# Patient Record
Sex: Female | Born: 1980 | Race: Black or African American | Hispanic: No | Marital: Single | Smoking: Former smoker
Health system: Southern US, Community
[De-identification: ages and names within clinical notes are randomized; demographics above are authoritative.]

## PROBLEM LIST (undated history)

## (undated) DIAGNOSIS — K219 Gastro-esophageal reflux disease without esophagitis: Secondary | ICD-10-CM

## (undated) DIAGNOSIS — I10 Essential (primary) hypertension: Secondary | ICD-10-CM

## (undated) HISTORY — DX: Gastro-esophageal reflux disease without esophagitis: K21.9

---

## 2004-11-24 ENCOUNTER — Observation Stay: Payer: Self-pay | Admitting: Obstetrics and Gynecology

## 2004-12-30 ENCOUNTER — Observation Stay: Payer: Self-pay | Admitting: Obstetrics and Gynecology

## 2005-01-29 ENCOUNTER — Inpatient Hospital Stay: Payer: Self-pay | Admitting: Obstetrics and Gynecology

## 2005-12-13 ENCOUNTER — Emergency Department: Payer: Self-pay

## 2005-12-14 ENCOUNTER — Ambulatory Visit: Payer: Self-pay

## 2007-01-29 ENCOUNTER — Emergency Department: Payer: Self-pay | Admitting: Unknown Physician Specialty

## 2007-05-27 ENCOUNTER — Ambulatory Visit: Payer: Self-pay | Admitting: Family Medicine

## 2007-10-11 ENCOUNTER — Observation Stay: Payer: Self-pay

## 2007-10-15 ENCOUNTER — Inpatient Hospital Stay: Payer: Self-pay | Admitting: Obstetrics and Gynecology

## 2008-11-16 ENCOUNTER — Observation Stay: Payer: Self-pay | Admitting: Unknown Physician Specialty

## 2009-02-24 IMAGING — US US OB US >=[ID] SNGL FETUS
1 series · 14 of 28 positions shown · non-contrast
Comparison: none

REASON FOR EXAM: size dates fetal anatomy
COMMENTS:

[Series 1: us ob us >=(id) sngl fetus · 0.33mm/px · 14 of 59 slices shown]
[im 3/59]
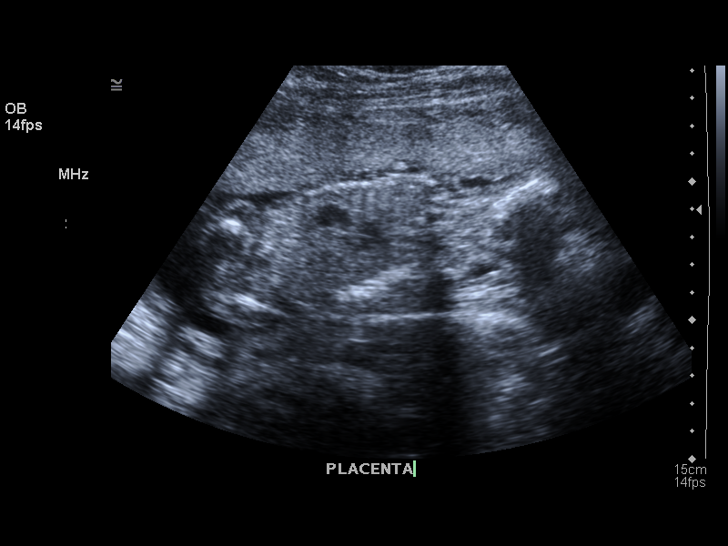
[im 7/59]
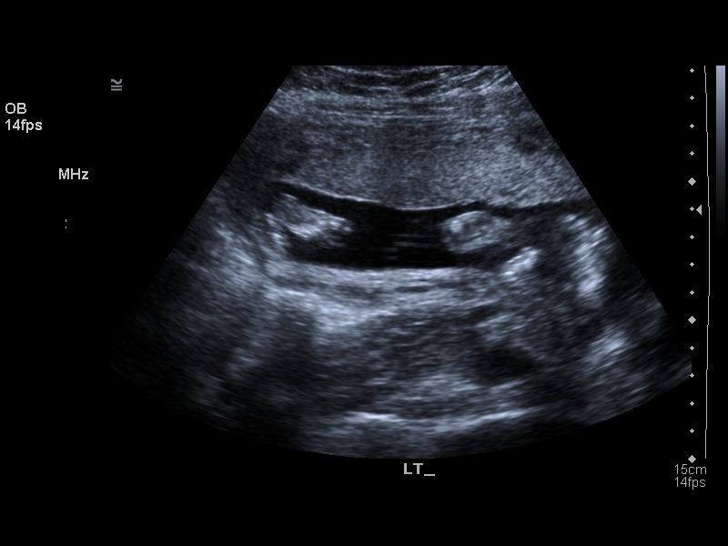
[im 11/59]
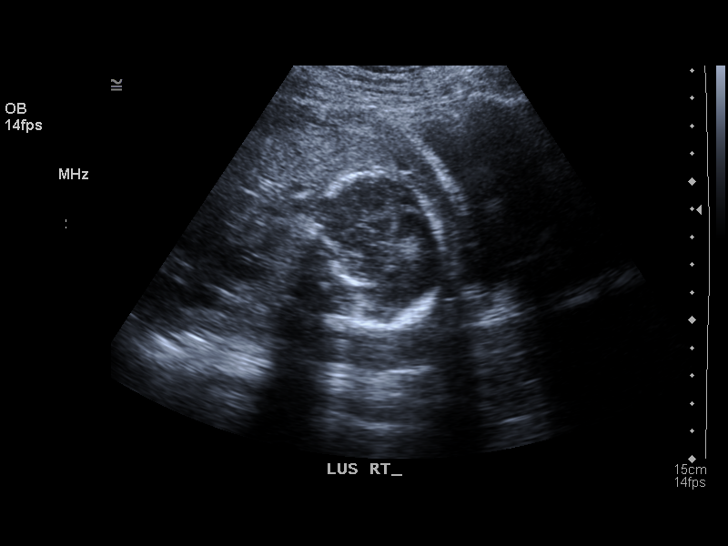
[im 16/59]
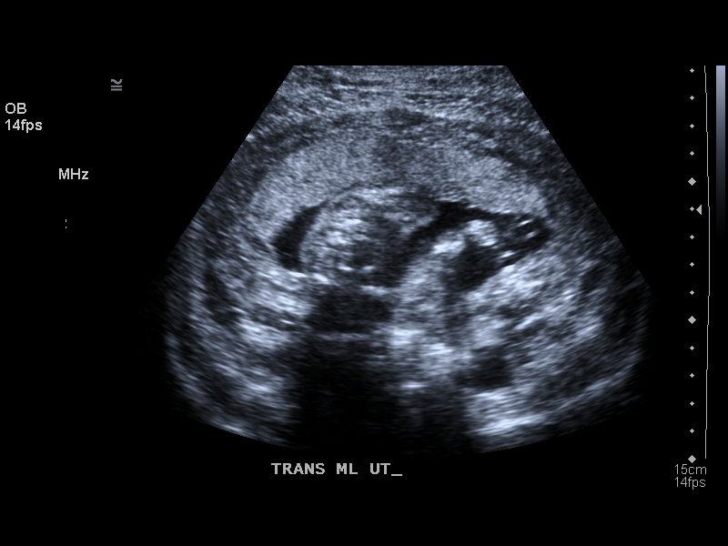
[im 20/59]
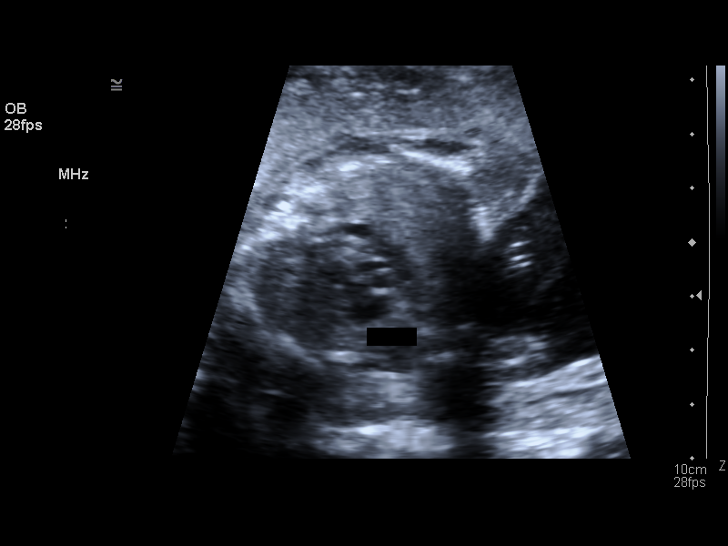
[im 24/59]
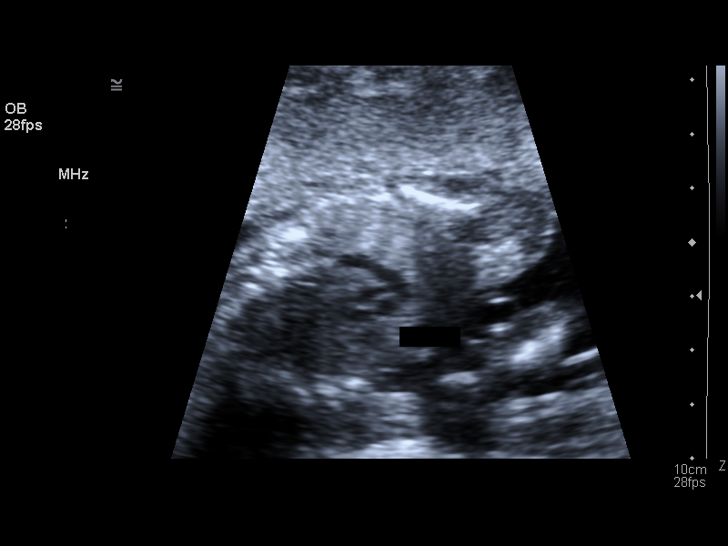
[im 28/59]
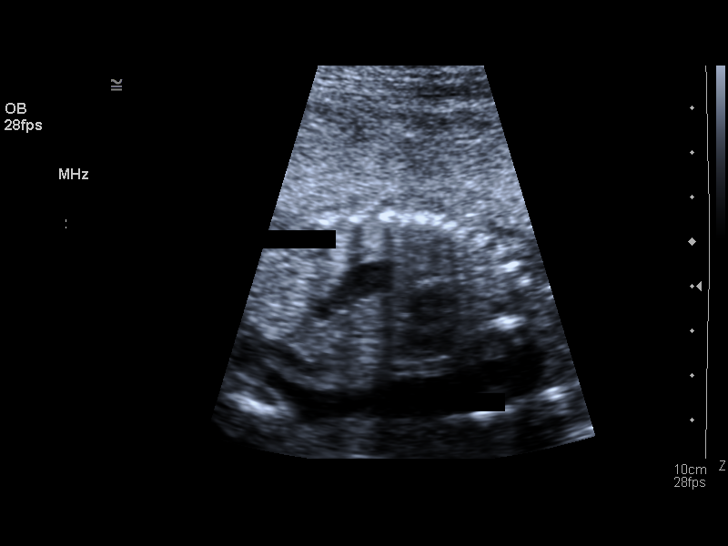
[im 33/59]
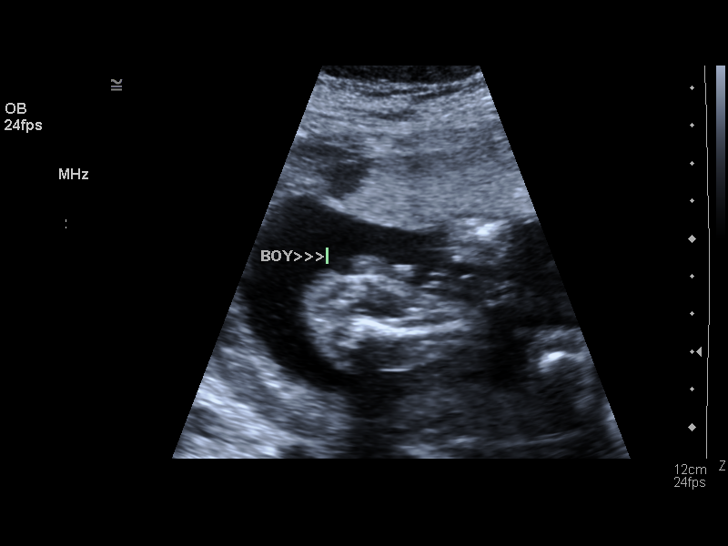
[im 37/59]
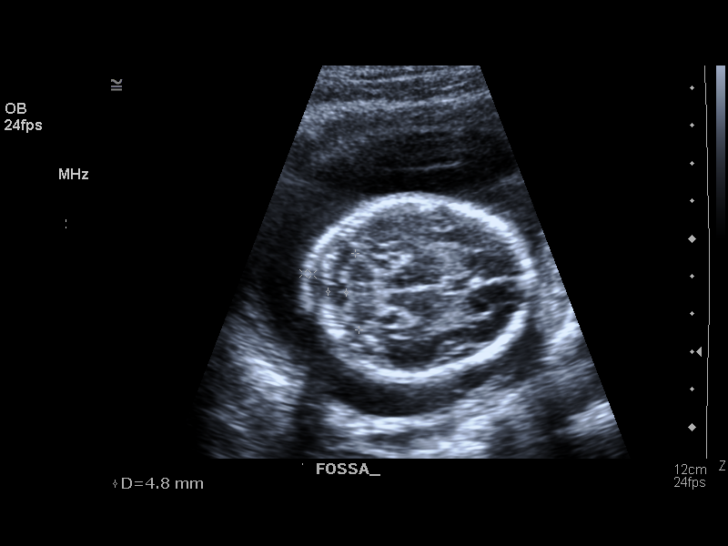
[im 41/59]
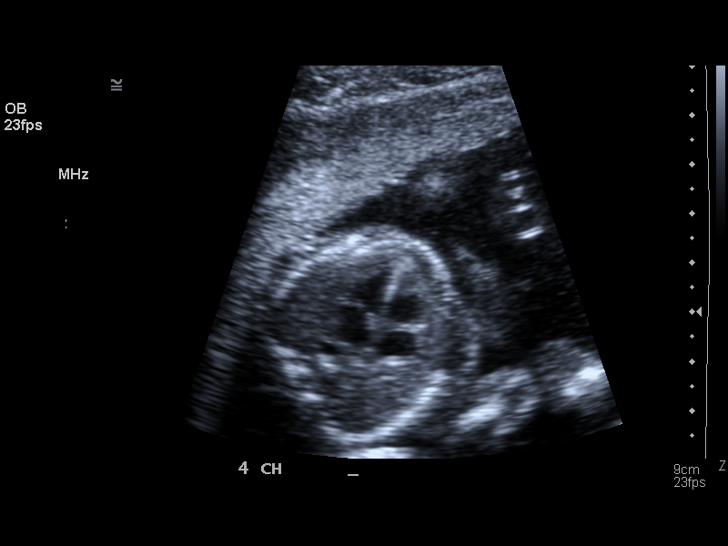
[im 46/59]
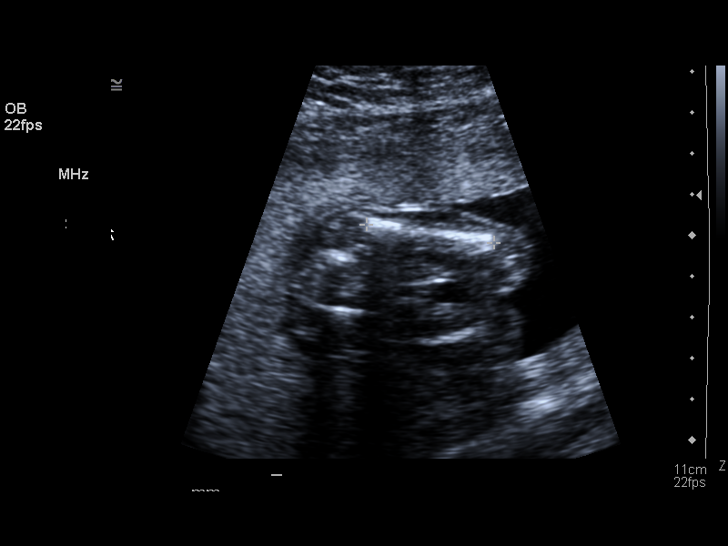
[im 50/59]
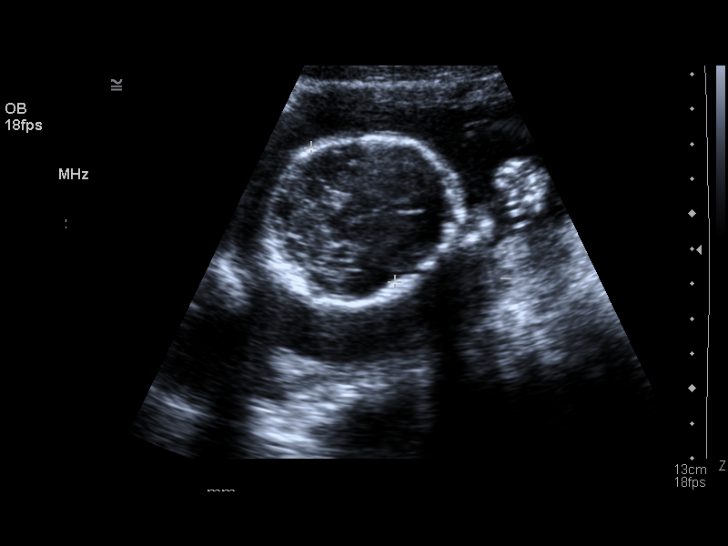
[im 54/59]
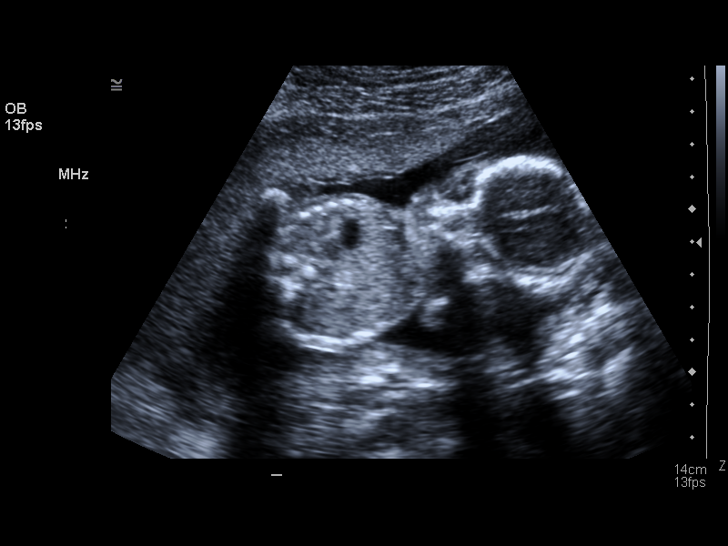
[im 59/59]
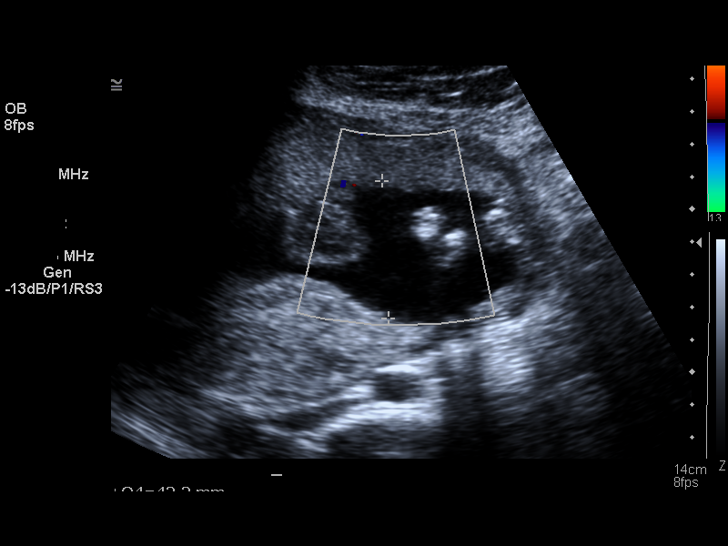

[14 of 28 positions shown; findings below may reference images not displayed]

PROCEDURE:     US  - US OB GREATER/OR EQUAL TO WJRG1  - May 27, 2007  [DATE]

RESULT:     There is observed a single living intrauterine gestation.
Presentation currently is cephalic. The placenta is anterior. Amnionic fluid
volume appears normal. Fetal heart, stomach, and urinary bladder are
visualized. No hydrocephalus or hydronephrosis is seen. Fetal heart rate was
monitored at 150 beats per minute. Fetal measurements are as follows:

BPD: 45.2 mm (19 weeks-8 days)
HC: 175.2 mm (20 weeks-1 days)
AC: 118.9 mm (17 weeks-1 days)
FL: 31.5 mm (19 weeks-8 days)
HL: 32.4 mm (20 weeks-8 days)

EFW is 258 grams. Average ultrasound age is 19 weeks-1 days. Ultrasound EDD
is November 16, 2007.
IMPRESSION: 1.     Please see above.

## 2009-02-25 ENCOUNTER — Ambulatory Visit: Payer: Self-pay | Admitting: Obstetrics and Gynecology

## 2009-02-28 ENCOUNTER — Inpatient Hospital Stay: Payer: Self-pay | Admitting: Obstetrics and Gynecology

## 2010-04-25 ENCOUNTER — Emergency Department: Payer: Self-pay | Admitting: Emergency Medicine

## 2010-08-17 IMAGING — US US OB US >=[ID] SNGL FETUS
1 series · 13 of 28 positions shown · non-contrast
Comparison: none

REASON FOR EXAM: No prenatal care, gestational age
COMMENTS:   LMP: > one month ago

[Series 1: us ob us >=(id) sngl fetus · 0.31mm/px · 13 of 106 slices shown]
[im 4/106]
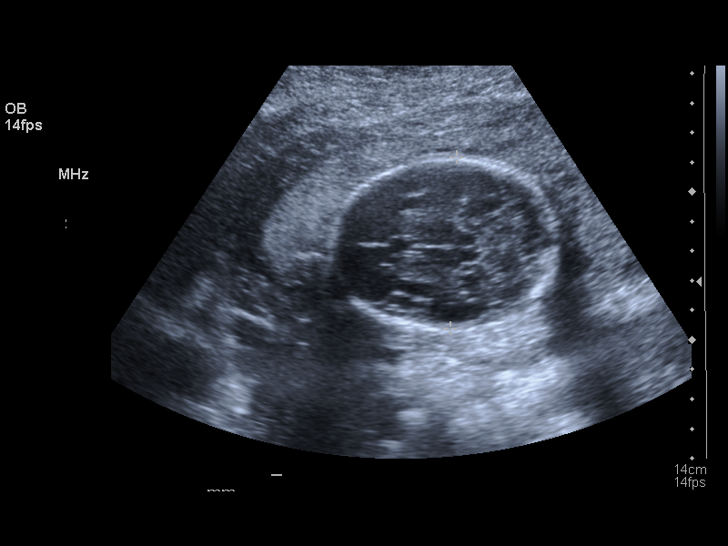
[im 12/106]
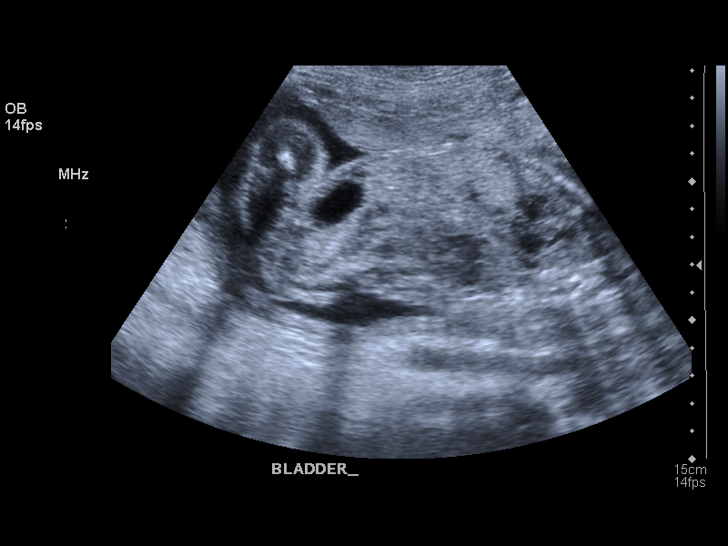
[im 20/106]
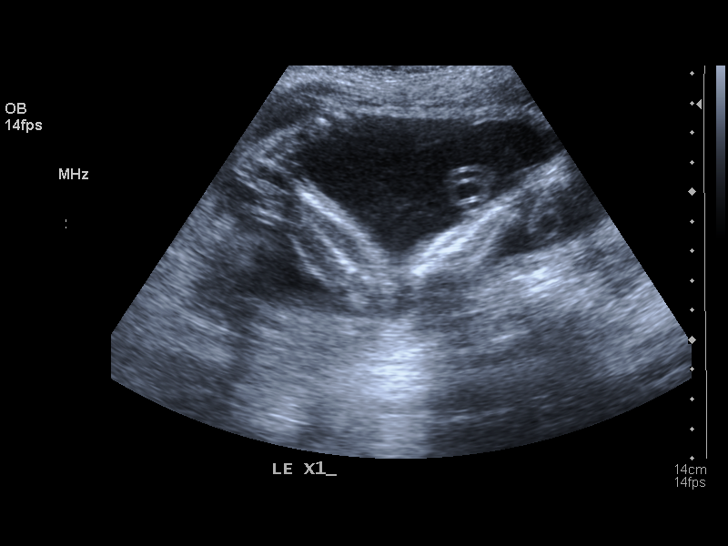
[im 28/106]
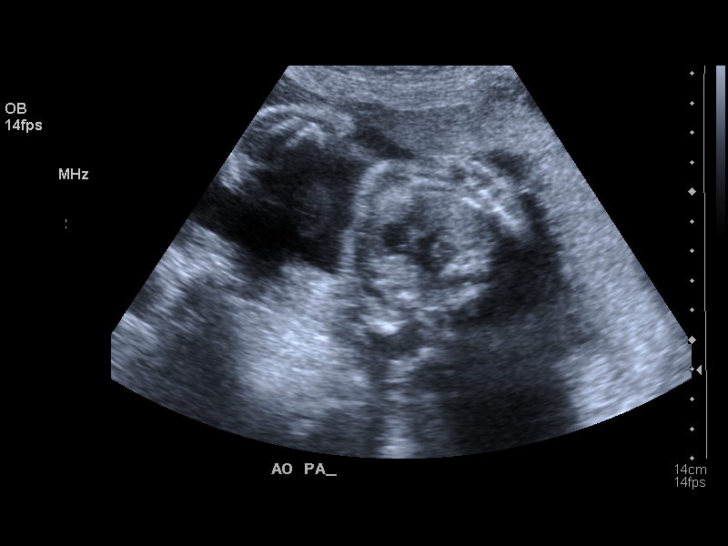
[im 36/106]
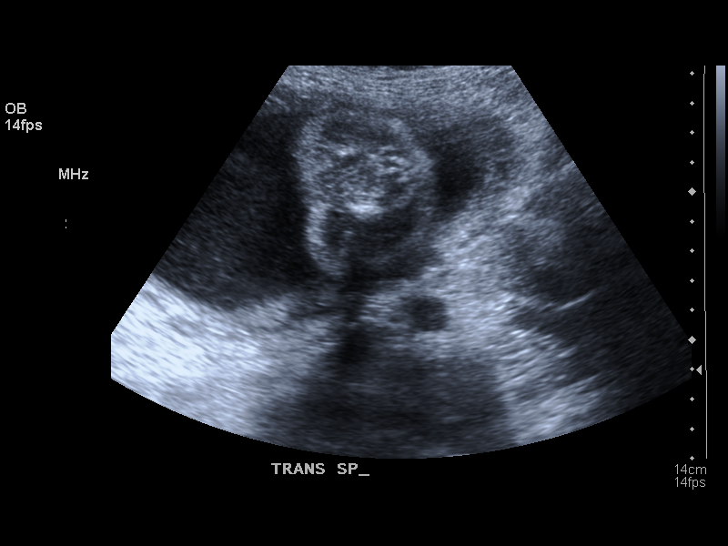
[im 43/106]
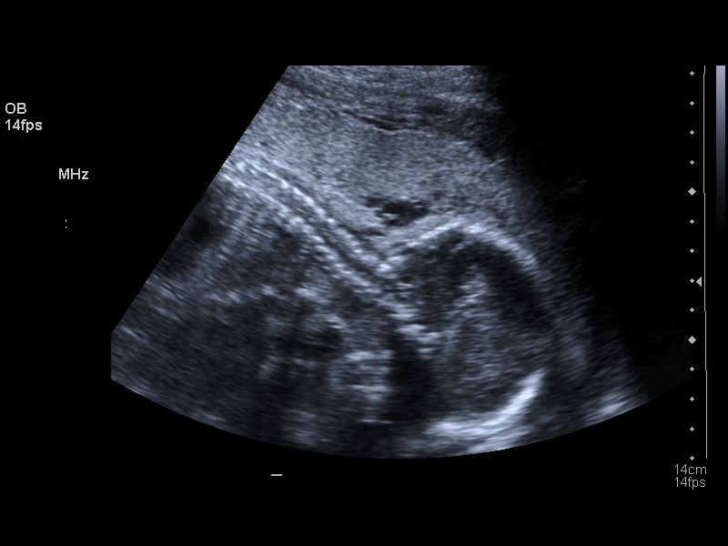
[im 55/106]
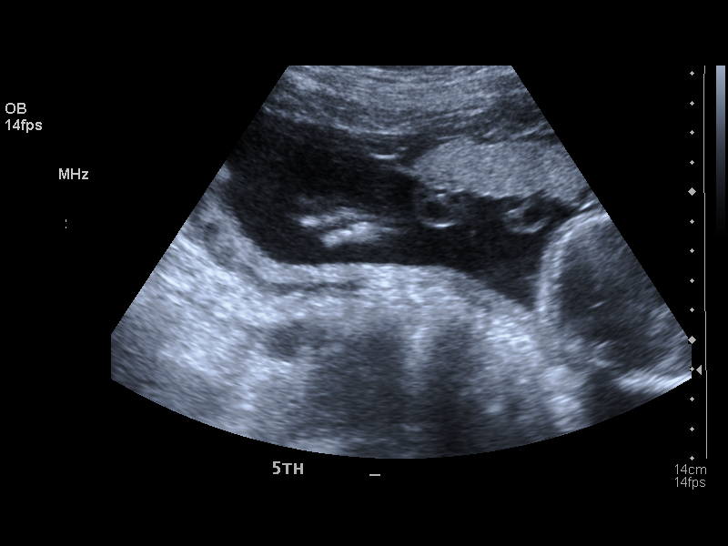
[im 63/106]
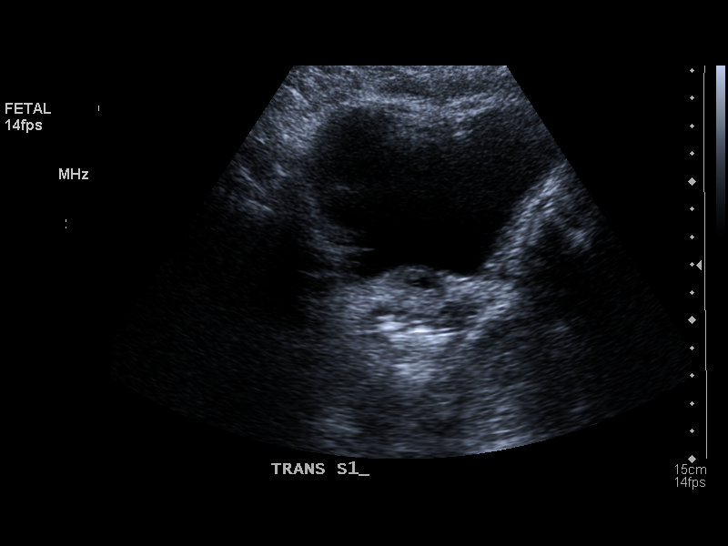
[im 71/106]
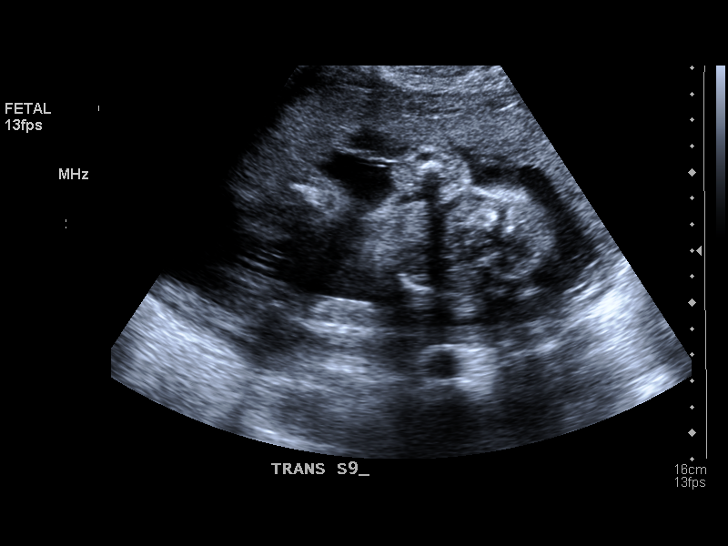
[im 78/106]
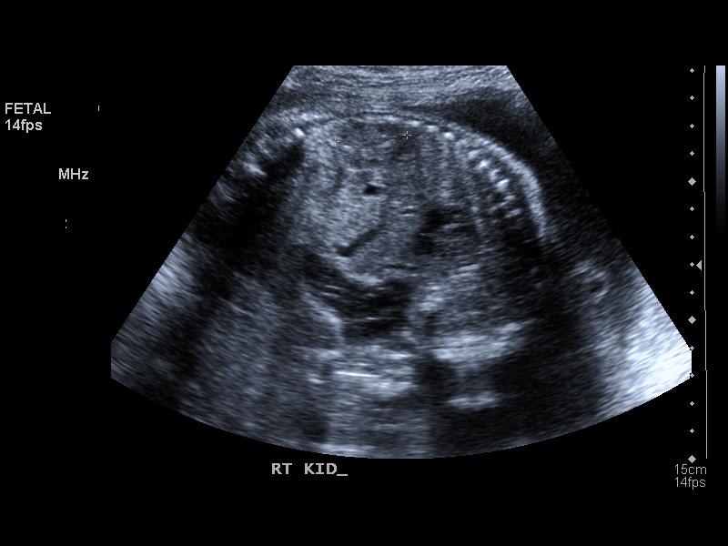
[im 86/106]
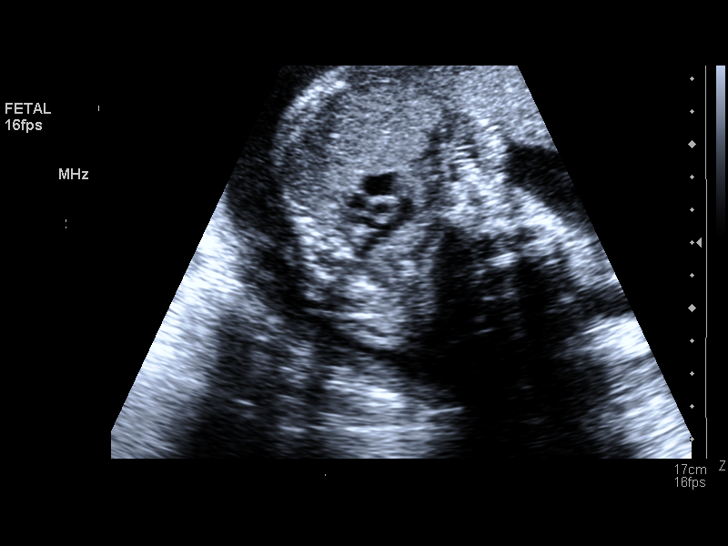
[im 94/106]
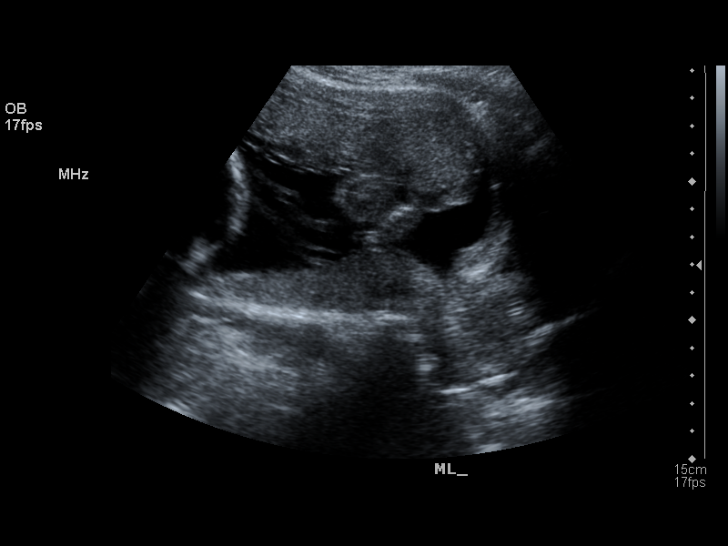
[im 102/106]
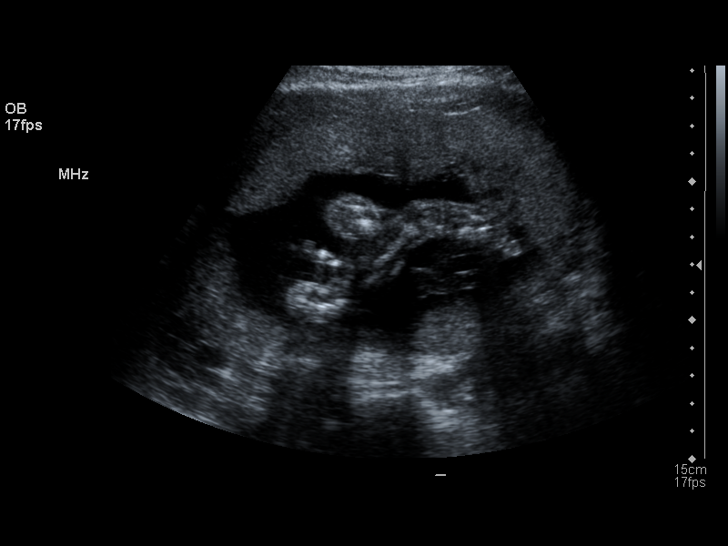

[13 of 28 positions shown; findings below may reference images not displayed]

PROCEDURE:     US  - US OB GREATER/OR EQUAL TO AQCUQ  - November 16, 2008 [DATE]

RESULT:     There is a viable IUP with cephalic presentation. The placenta
is anterior with no evidence of placenta previa. The amniotic fluid volume
is estimated to be normal. The intracranial structures, the craniocervical
junction, and the spinal structures appear normal where visualized. A 4
chambered heart with a rate of 139 beats per minute was demonstrated. The
fetal stomach, kidneys, and urinary bladder were demonstrated and appeared
normal.

Measured parameters:

BPD 57.8 mm corresponding to an EGA of 23 weeks and days
HC 219.7 mm corresponding to an EGA of 24 weeks 0 days
A.C. 201.5 mm corresponding to an EGA of 24 weeks 5 days
FL 44 mm corresponding to an EGA of 24 weeks 3 days
Estimated fetal weight is 706 grams + / - 104 grams.
IMPRESSION: There is a viable IUP with estimated gestational age of 24
weeks 2 days plus or minus approximately 12 days. The estimated date of
confinement is 06 March, 2009. This date of delivery is approximately 2
weeks earlier than that predicted on clinical grounds. No fetal anomalies
were identified.

## 2011-02-15 ENCOUNTER — Ambulatory Visit: Payer: Self-pay | Admitting: Family Medicine

## 2011-05-31 ENCOUNTER — Encounter: Payer: Self-pay | Admitting: Maternal and Fetal Medicine

## 2011-05-31 LAB — CBC WITH DIFFERENTIAL/PLATELET
Basophil #: 0 10*3/uL (ref 0.0–0.1)
Basophil %: 0.4 %
Eosinophil #: 0.1 10*3/uL (ref 0.0–0.7)
HGB: 11.3 g/dL — ABNORMAL LOW (ref 12.0–16.0)
Lymphocyte #: 1.4 10*3/uL (ref 1.0–3.6)
Lymphocyte %: 20.7 %
MCHC: 33.3 g/dL (ref 32.0–36.0)
MCV: 87 fL (ref 80–100)
Neutrophil #: 4.5 10*3/uL (ref 1.4–6.5)
Neutrophil %: 68.7 %
RBC: 3.89 10*6/uL (ref 3.80–5.20)
WBC: 6.6 10*3/uL (ref 3.6–11.0)

## 2011-05-31 LAB — BASIC METABOLIC PANEL
Anion Gap: 10 (ref 7–16)
BUN: 4 mg/dL — ABNORMAL LOW (ref 7–18)
Chloride: 106 mmol/L (ref 98–107)
Creatinine: 0.43 mg/dL — ABNORMAL LOW (ref 0.60–1.30)
EGFR (African American): >60
EGFR (Non-African Amer.): >60
Glucose: 86 mg/dL (ref 65–99)
Osmolality: 279 (ref 275–301)

## 2011-05-31 LAB — HEPATIC FUNCTION PANEL A (ARMC)
Alkaline Phosphatase: 88 U/L (ref 50–136)
SGOT(AST): 19 U/L (ref 15–37)
SGPT (ALT): 14 U/L
Total Protein: 7.2 g/dL (ref 6.4–8.2)

## 2011-06-04 ENCOUNTER — Encounter: Payer: Self-pay | Admitting: Maternal & Fetal Medicine

## 2011-06-18 ENCOUNTER — Observation Stay: Payer: Self-pay

## 2011-06-18 LAB — PIH PROFILE
Anion Gap: 10 (ref 7–16)
BUN: 6 mg/dL — ABNORMAL LOW (ref 7–18)
Creatinine: 0.32 mg/dL — ABNORMAL LOW (ref 0.60–1.30)
EGFR (African American): >60
EGFR (Non-African Amer.): >60
Glucose: 81 mg/dL (ref 65–99)
HCT: 31.7 % — ABNORMAL LOW (ref 35.0–47.0)
HGB: 10.6 g/dL — ABNORMAL LOW (ref 12.0–16.0)
RBC: 3.68 10*6/uL — ABNORMAL LOW (ref 3.80–5.20)
RDW: 13.4 % (ref 11.5–14.5)
SGOT(AST): 23 U/L (ref 15–37)
Sodium: 140 mmol/L (ref 136–145)
Uric Acid: 3.3 mg/dL (ref 2.6–6.0)
WBC: 5.1 10*3/uL (ref 3.6–11.0)

## 2011-06-18 LAB — PROTEIN / CREATININE RATIO, URINE
Protein, Random Urine: 19 mg/dL — ABNORMAL HIGH (ref 0–12)
Protein/Creat. Ratio: 157 mg/gCREAT (ref 0–200)

## 2011-06-21 ENCOUNTER — Ambulatory Visit: Payer: Self-pay | Admitting: Obstetrics and Gynecology

## 2011-06-21 LAB — CBC WITH DIFFERENTIAL/PLATELET
Basophil #: 0 10*3/uL (ref 0.0–0.1)
Eosinophil #: 0.1 10*3/uL (ref 0.0–0.7)
Eosinophil %: 1.5 %
HCT: 31.8 % — ABNORMAL LOW (ref 35.0–47.0)
Lymphocyte #: 1.2 10*3/uL (ref 1.0–3.6)
Lymphocyte %: 22.9 %
MCHC: 33.4 g/dL (ref 32.0–36.0)
MCV: 87 fL (ref 80–100)
Monocyte %: 5.9 %
Neutrophil #: 3.5 10*3/uL (ref 1.4–6.5)
Neutrophil %: 69.3 %
Platelet: 97 10*3/uL — ABNORMAL LOW (ref 150–440)
RBC: 3.66 10*6/uL — ABNORMAL LOW (ref 3.80–5.20)
RDW: 13.1 % (ref 11.5–14.5)
WBC: 5.1 10*3/uL (ref 3.6–11.0)

## 2011-06-22 ENCOUNTER — Inpatient Hospital Stay: Payer: Self-pay | Admitting: Obstetrics and Gynecology

## 2011-06-22 LAB — CBC WITH DIFFERENTIAL/PLATELET
Basophil #: 0 10*3/uL (ref 0.0–0.1)
Basophil %: 0.2 %
Basophil %: 0.1 %
Eosinophil #: 0 10*3/uL (ref 0.0–0.7)
Eosinophil %: 0.1 %
HCT: 33.2 % — ABNORMAL LOW (ref 35.0–47.0)
HCT: 25.4 % — ABNORMAL LOW (ref 35.0–47.0)
HGB: 11 g/dL — ABNORMAL LOW (ref 12.0–16.0)
HGB: 8.5 g/dL — ABNORMAL LOW (ref 12.0–16.0)
Lymphocyte %: 12.7 %
Lymphocyte %: 7.5 %
MCHC: 33.6 g/dL (ref 32.0–36.0)
MCV: 87 fL (ref 80–100)
MCV: 87 fL (ref 80–100)
Monocyte #: 0.6 10*3/uL (ref 0.0–0.7)
Monocyte %: 5.3 %
Neutrophil #: 5.7 10*3/uL (ref 1.4–6.5)
Neutrophil %: 81.7 %
Neutrophil %: 86.5 %
RBC: 3.82 10*6/uL (ref 3.80–5.20)
RBC: 2.92 10*6/uL — ABNORMAL LOW (ref 3.80–5.20)
RDW: 13.2 % (ref 11.5–14.5)
WBC: 6.9 10*3/uL (ref 3.6–11.0)
WBC: 9.6 10*3/uL (ref 3.6–11.0)

## 2011-06-23 LAB — CBC WITH DIFFERENTIAL/PLATELET
Basophil #: 0 10*3/uL (ref 0.0–0.1)
Basophil %: 0 %
HCT: 26.2 % — ABNORMAL LOW (ref 35.0–47.0)
HGB: 8.7 g/dL — ABNORMAL LOW (ref 12.0–16.0)
Lymphocyte %: 5.3 %
MCV: 87 fL (ref 80–100)
Monocyte %: 3.1 %
Neutrophil #: 10.1 10*3/uL — ABNORMAL HIGH (ref 1.4–6.5)
Neutrophil %: 91.6 %
RBC: 3 10*6/uL — ABNORMAL LOW (ref 3.80–5.20)
RDW: 13.5 % (ref 11.5–14.5)
WBC: 11 10*3/uL (ref 3.6–11.0)

## 2011-06-24 LAB — CBC WITH DIFFERENTIAL/PLATELET
Basophil #: 0 10*3/uL (ref 0.0–0.1)
Basophil %: 0 %
HCT: 23.2 % — ABNORMAL LOW (ref 35.0–47.0)
Lymphocyte #: 1.5 10*3/uL (ref 1.0–3.6)
Lymphocyte %: 18.7 %
MCH: 28.1 pg (ref 26.0–34.0)
MCHC: 32 g/dL (ref 32.0–36.0)
MCV: 88 fL (ref 80–100)
Monocyte #: 0.8 10*3/uL — ABNORMAL HIGH (ref 0.0–0.7)
Monocyte %: 10.5 %
Neutrophil #: 5.6 10*3/uL (ref 1.4–6.5)
RDW: 13.4 % (ref 11.5–14.5)
WBC: 7.9 10*3/uL (ref 3.6–11.0)

## 2012-11-15 IMAGING — US US OB US >=[ID] SNGL FETUS
1 series · 17 of 28 positions shown · non-contrast
Comparison: none

REASON FOR EXAM: anatomy and placenta location dates
COMMENTS:

[Series 1: us ob us >=(id) sngl fetus · 17 of 94 slices shown]
[im 1/94]
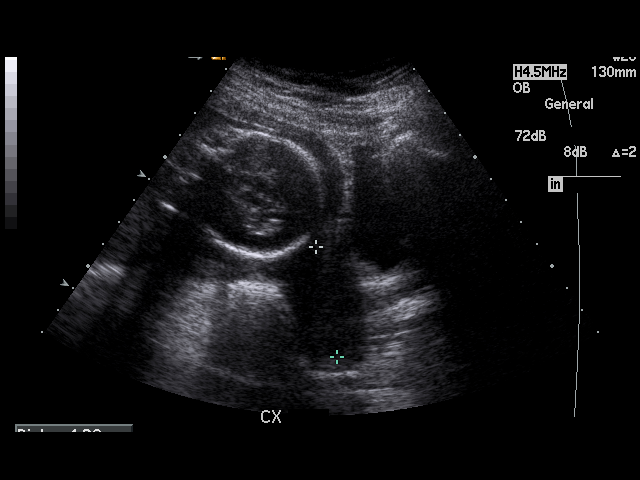
[im 7/94]
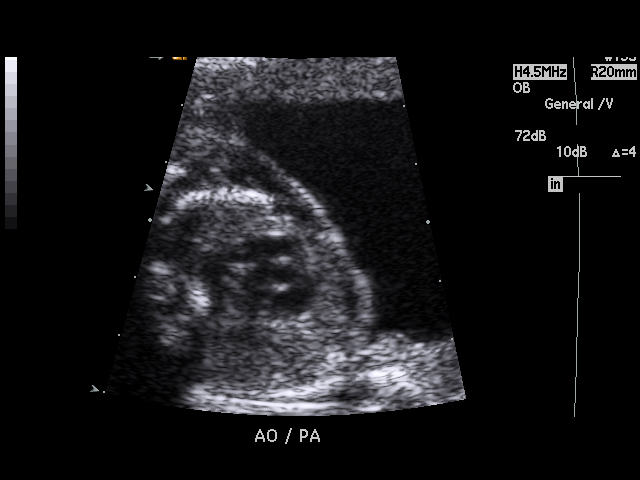
[im 14/94]
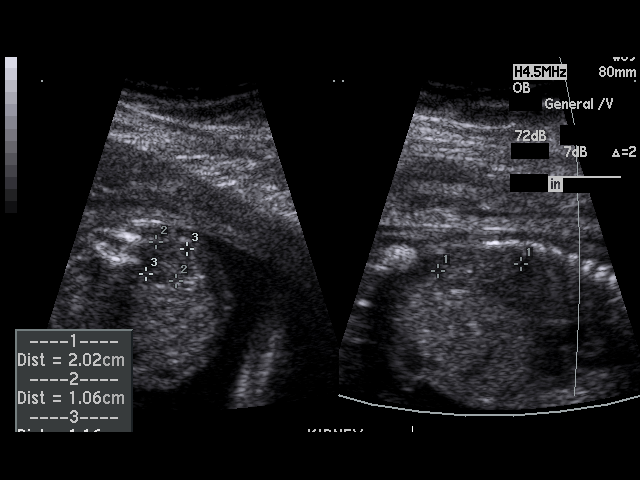
[im 18/94]
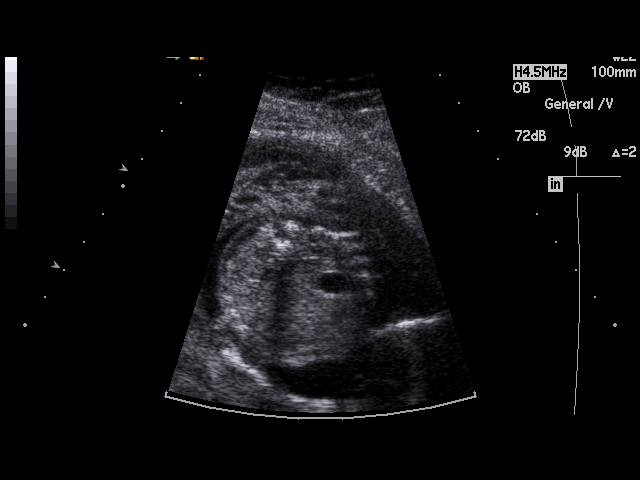
[im 25/94]
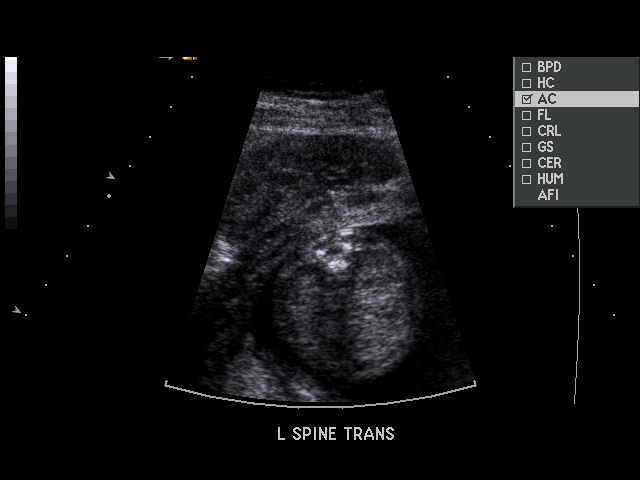
[im 32/94]
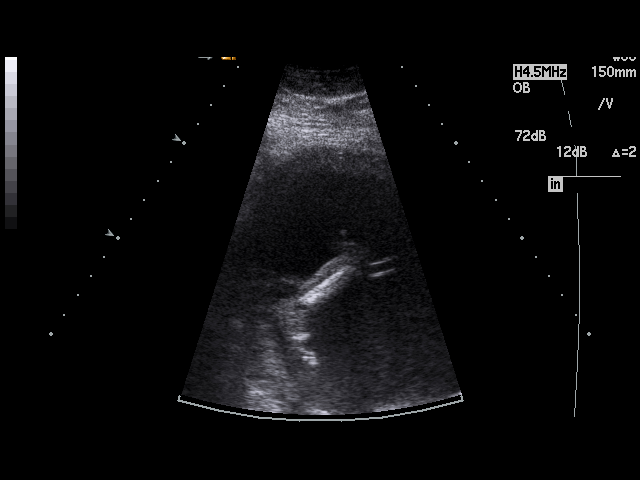
[im 35/94]
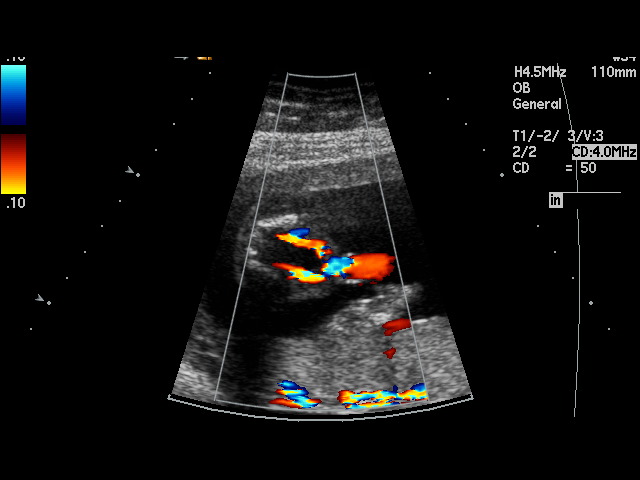
[im 42/94]
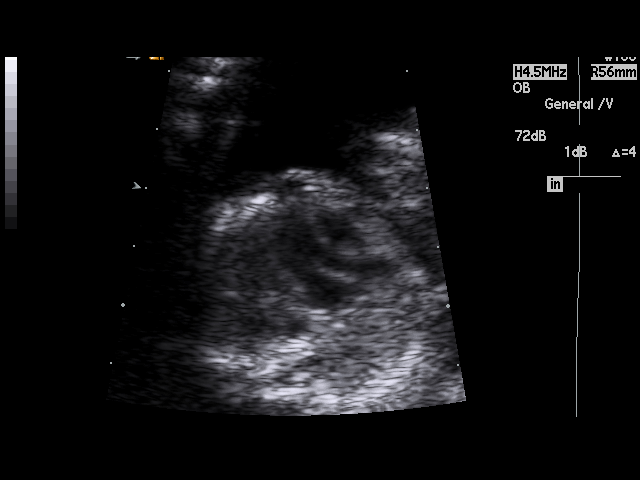
[im 49/94]
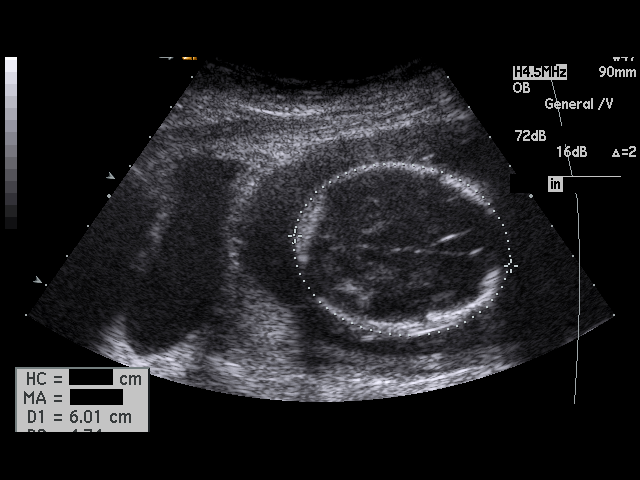
[im 52/94]
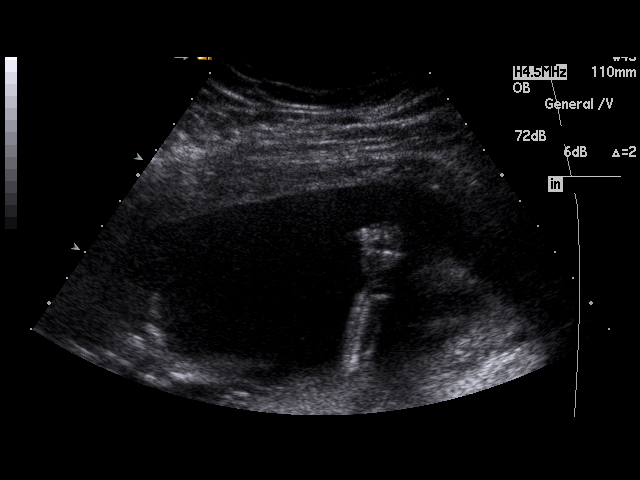
[im 59/94]
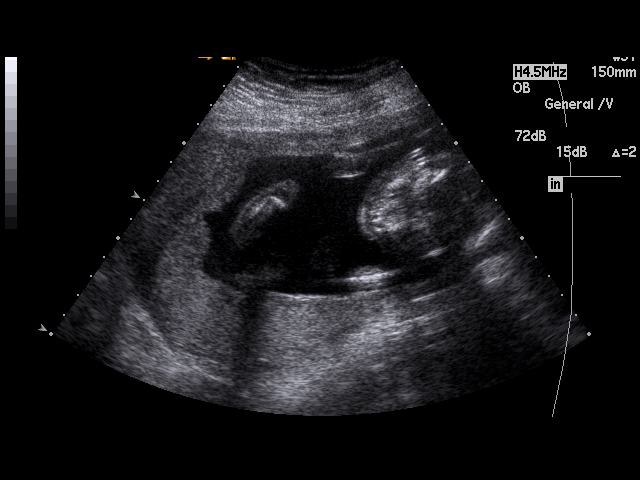
[im 63/94]
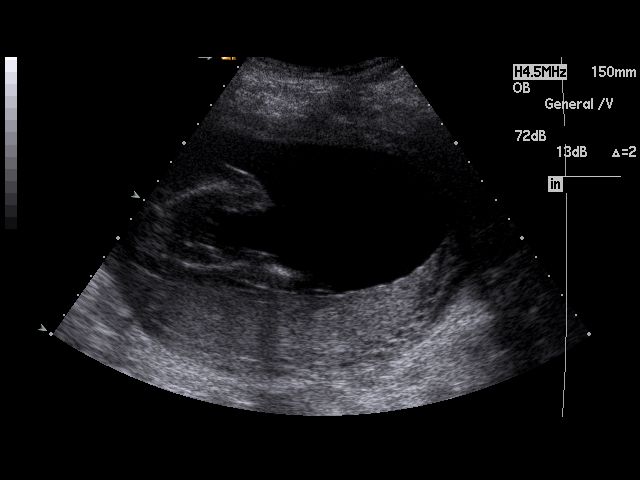
[im 69/94]
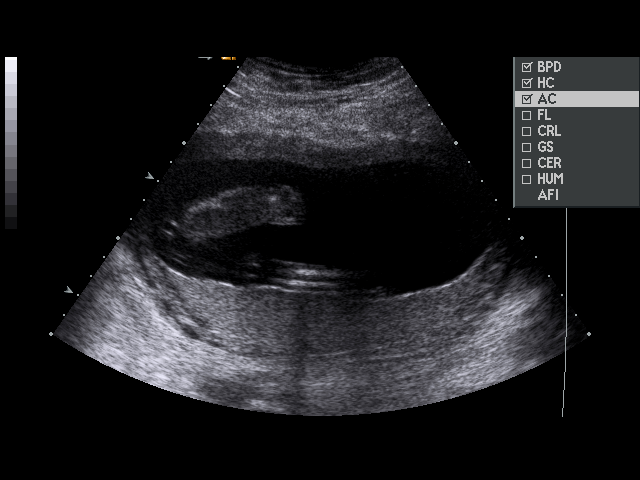
[im 76/94]
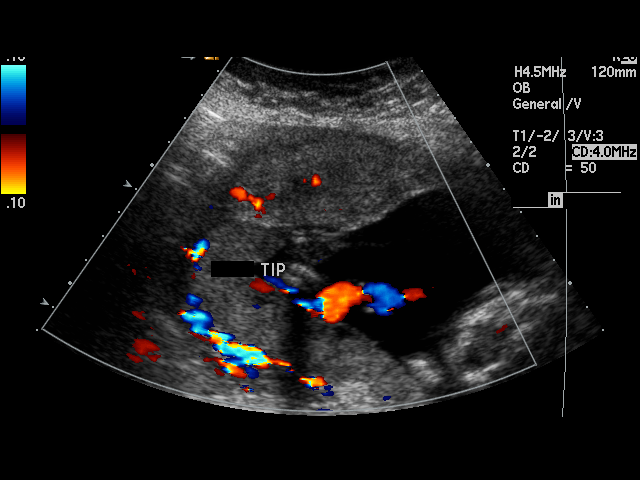
[im 80/94]
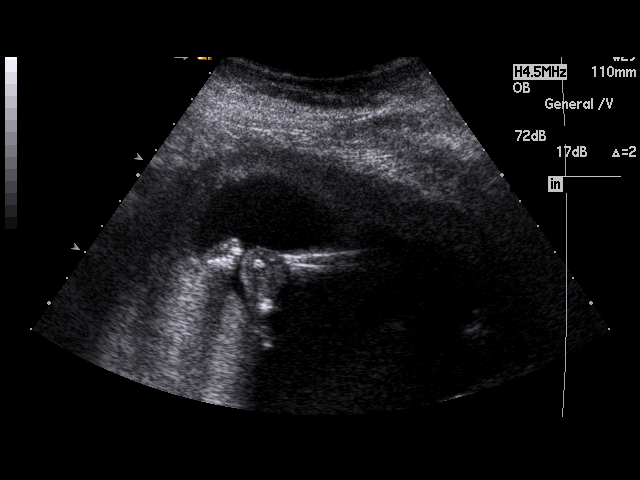
[im 87/94]
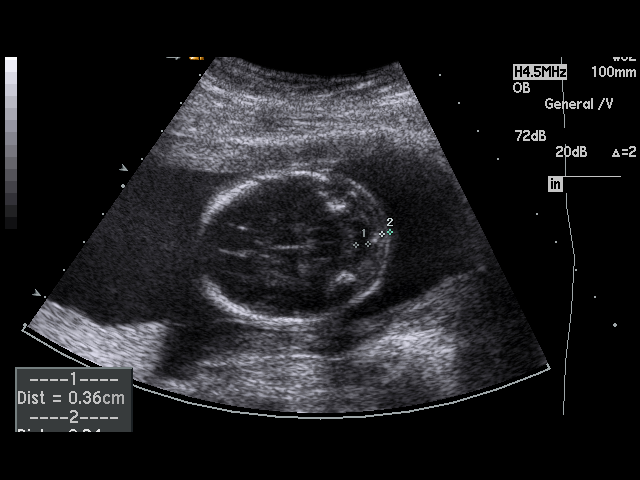
[im 94/94]
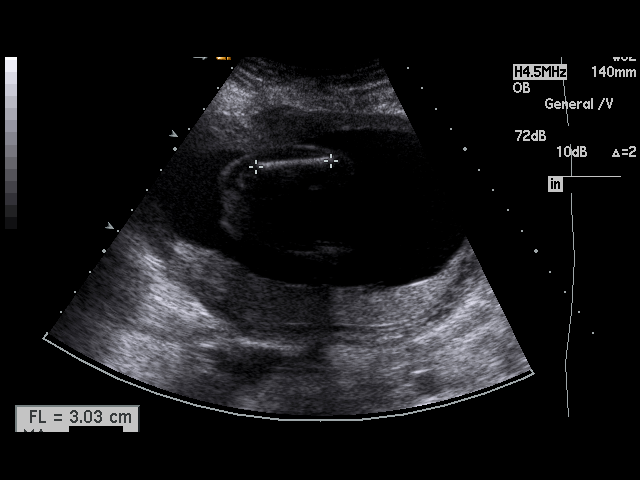

[17 of 28 positions shown; findings below may reference images not displayed]

PROCEDURE:     US  - US OB GREATER/OR EQUAL TO [id number]  - February 15, 2011  [DATE]

RESULT:     OB ultrasound is performed utilizing transabdominal acquisition.
The length of the cervix is 4.20 cm. The distance from the lower margin of
the posterior placenta to the internal cervical os is approximately 6.13 cm.
The placenta is posterior extending to the fundus. Fetal cardiac activity is
present and measured at 150 beats per minute. There is an anterior fundal
uterine fibroid demonstrated measuring 5.5 x 3.4 x 4.1 cm. Fetal anatomy is
grossly normal. There is a cephalic presentation. The amniotic fluid volume
is visually normal. Gestational age is calculated at 19 weeks-7 days plus or
minus a standard deviation of between 10 and 14 days with an ultrasound EDD
estimated at 07/08/2011. Current estimated fetal weight sonographically is 326
grams + / - 44 grams.
IMPRESSION: Single intrauterine gestation of approximately 19 weeks-7
days. Uterine fibroid present superiorly in the anterior fundal region. No
fetal anatomic abnormality evident.

## 2014-08-29 NOTE — Consult Note (Signed)
Referral Information:   Reason for Referral Hypertension in Pregnancy    Referring Physician [place]    Prenatal Hx 34 year old [id number] with EDC of 07/08/2011 (35 1/7weeks) presents for lab follow up.  She is currently on $RemoveBefo'100mg'zuQHvZgcZTj$  BID of labetalol (didn't take this am due).   Home Medications:  ferrous sulfate 325 mg (65 mg elemental iron) oral tablet: 1 tab(s) orally once a day, Active  labetalol 100 mg oral tablet: 1 tab(s) orally 2 times a day, Active  Allergies:   No Known Allergies:   Vital Signs/Notes:  Nursing Vital Signs: **Vital Signs.:   28-Jan-13 11:24   Vital Signs Type Routine   Pulse Pulse 103   Pulse source per Dinamap   Respirations Respirations 16   Systolic BP Systolic BP 127   Diastolic BP (mmHg) Diastolic BP (mmHg) 81   Mean BP 96   BP Source Dinamap; pt did not take BP medication   Routine Hem:  24-Jan-13 16:48    WBC (CBC) 6.6   RBC (CBC) 3.89   Hemoglobin (CBC) 11.3   Hematocrit (CBC) 33.9   Platelet Count (CBC) 107   MCV 87   MCH 29.0   MCHC 33.3   RDW 13.4   Neutrophil % 68.7   Lymphocyte % 20.7   Monocyte % 9.2   Eosinophil % 1.0   Basophil % 0.4   Neutrophil # 4.5   Lymphocyte # 1.4   Monocyte # 0.6   Eosinophil # 0.1   Basophil # 0.0  Routine Chem:  24-Jan-13 16:48    Uric Acid, Serum 2.8   Glucose, Serum 86   BUN 4   Creatinine (comp) 0.43   Sodium, Serum 142   Potassium, Serum 3.4   Chloride, Serum 106   CO2, Serum 26   Calcium (Total), Serum 8.9   Anion Gap 10   Osmolality (calc) 279   eGFR (African American) >60   eGFR (Non-African American) >60  Hepatic:  24-Jan-13 16:48    Bilirubin, Total 0.4   Bilirubin, Direct < 0.1   Alkaline Phosphatase 88   SGPT (ALT) 14   SGOT (AST) 19   Total Protein, Serum 7.2   Albumin, Serum 2.9     Additional Lab/Radiology Notes Labs reviewed and within normal limits except for platelets of 107 - were 122 on 05/09/11 at [facility].   24hr urine pending.    Impression/Recommendations:   Impression 34 year old G5P3013 at [redacted]w[redacted]d with hypertension on low dose labetalol.  BP today 128/81 without taking her am dose. Labs reviewed and are normal except for plts of 107 (previously 122 - also low).  No signs or symptoms of preeclampsia.  NST reactive today.    Recommendations 1.  Last EFW 1/24 was 39th% 2.  Continue twice weekly NST, weekly AFI (may performed at [facility]). 3.  Recommend repeat cbc within 1 week to follow platlets.  She may have an element of gestational thrombocytopenia, less likely that this is HELLP syndrome if this is the only abnormality with otherwise no maternal symptoms and normal BP.  If less than 100 would reconsult regarding management prior to c/s.  If stable, recommend weekly platelet levels until delivery.     Total Time Spent with Patient 10 minutes    >50% of visit spent in couseling/coordination of care yes    Office Use Only [id number]  Office Visit Level 2 (27min) EST prob focused office/outpt   Coding Description: MATERNAL CONDITIONS/HISTORY INDICATION(S).   HTN -  Gestational/Pregnancy Induced PIH.  Electronic Signatures: Wynona Neat (MD)  (Signed 28-Jan-13 12:12)  Authored: Referral, Home Medications, Allergies, Vital Signs/Notes, Lab, Lab/Radiology Notes, Impression, Billing, Coding Description   Last Updated: 28-Jan-13 12:12 by Wynona Neat (MD)

## 2014-08-29 NOTE — Consult Note (Signed)
Referral Information:   Reason for Referral Hypertension in Pregnancy    Referring Physician Sharyne Peachharle [facility]    Prenatal Hx 34 year old [id number] with EDC of 07/08/2011 (34 4/7weeks) presents for evaluation and management for new onset hypertension. The patient reports a previously uncomplicated antenatal course. She states that about 3 weeks ago, her doctors noted an increase in her blood pressure. For this, she was started on Labetalol. She states that she did not have any blood or urine testing for this.  The patient reports that a few weeks after her last pregnancy she was diagnosed with hypertension. For this, she was started on an antihypertensive (does not know name). She states that she lost weight and exercised, and after 1 month, she was able to discontinue the medication.    Past Obstetrical Hx G1 -- 2006: Cesarean delivery for 9#0oz female; cesarean done for abnormal fetal heart rate tracing; otherwise uncomplicated pregnancy G2 -- 2007; Elective abortion G3 -- 2009: Elective cesarean delivery for a 7#6oz female; uncomplicated pregnancy G4 -- 2010: Elective cesarean delivery for a 7#8oz female; uncomplicated pregnancy   Home Medications:  ferrous sulfate 325 mg (65 mg elemental iron) oral tablet: 1 tab(s) orally once a day, Active  labetalol 100 mg oral tablet: 1 tab(s) orally 2 times a day, Active  Allergies:   No Known Allergies:   Vital Signs/Notes:  Nursing Vital Signs: **Vital Signs.:   24-Jan-13 14:57   Vital Signs Type Routine   Temperature Temperature (F) 98.2   Celsius 36.7   Temperature Source oral   Pulse Pulse 80   Pulse source per Dinamap   Respirations Respirations 16   Systolic BP Systolic BP 136   Diastolic BP (mmHg) Diastolic BP (mmHg) 72   Mean BP 93   BP Source Dinamap   Perinatal Consult:   Past Medical History cont'd Gyn Hx menses 13/28/2; no STDs; no dyplasia; no gyn diagnoses PMH Other surgery - none Other illness - none Meds  - as above NKDA Transfusions - none Immunizations - up to date SocHx Smoking -- 1 ppd when not pregnant; now 1 cig / day EtOH - none Drugs - none Employment - caregiver Education - high school grad Single  ROS - as noted above, otherwise negative to review     Additional Lab/Radiology Notes U/S today - cGA = 34 1/7 weeks; cephalic; normal anatomy; normal fluid; EFW = 2406g (39th 5tile)   Impression/Recommendations:   Impression 1) Intrauterine pregnancy at 2234 4/7weeks 2) Pregnancy induced hypertension (with possible reimergence of chronic hypertension)    Recommendations 1) HELLP Screening labs -- ordered 2) 24 hour urine for total protein and creatinine clearance 3) Start weekly NST 4) Patient to have follow up appointmnet on 1/28 to review lab work and NST 5) Continue Labetalol for now     Total Time Spent with Patient 30 minutes    >50% of visit spent in couseling/coordination of care yes    Office Use Only [id number]  Level 2 (30min) NEW office consult exp prob focused   Coding Description: MATERNAL CONDITIONS/HISTORY INDICATION(S).   HTN - Gestational/Pregnancy Induced PIH.  Electronic Signatures: Marcelino ScotBrancazio, Darneisha Windhorst (MD)  (Signed 24-Jan-13 16:17)  Authored: Referral, Home Medications, Allergies, Vital Signs/Notes, Consult, Lab/Radiology Notes, Impression, Billing, Coding Description   Last Updated: 24-Jan-13 16:17 by Marcelino ScotBrancazio, Vestal Crandall (MD)

## 2014-08-29 NOTE — Op Note (Signed)
PATIENT NAME:  Sheena Combs, Sheena Combs MR#:  [id number] DATE OF BIRTH:  13-Oct-1980  DATE OF PROCEDURE:  06/22/2011  PREOPERATIVE DIAGNOSES: Elective repeat cesarean section, elective permanent sterilization.   POSTOPERATIVE DIAGNOSES: Elective repeat cesarean section, elective permanent sterilization.   PROCEDURES:  1. Repeat low transverse cesarean section.  2. Bilateral tubal ligation.  3. On-Q pump placement.   SURGEON: Jennell Corner, MD  FIRST ASSISTANT: Channing Mutters - scrub tech  ANESTHESIA: General endotracheal anesthesia.   INDICATIONS: This is a 34 year old gravida 5, para 3, a patient with three prior cesarean sections. The patient has elected for repeat cesarean section, elective permanent sterilization. The patient has a history of thrombocytopenia and on the day of the procedure platelet count was 91,000, manual count. Given her platelet count of less than 100,000, Dr. Pernell Dupre, anesthesiologist, elected to have the patient undergo general endotracheal anesthesia.   DESCRIPTION OF PROCEDURE: After general endotracheal anesthesia, which took several attempts by Dr. Pernell Dupre and nurse anesthetist, ultimately the patient was intubated. The patient did have minor desaturation during this. Dr. Pernell Dupre said her count was down to about 90 and ultimately the patient's airway was secure. The patient had been previously prepped and draped in sterile fashion and had received 2 grams IV Ancef. A Pfannenstiel incision was made two fingerbreadths above the symphysis pubis. Sharp dissection was used to identify the fascia. The fascia was opened in the midline and opened in a transverse fashion. The recti muscle was dissected free from the fascia. Dense adhesions were noted, which required sharp dissection to gain entrance into the peritoneal cavity. Several dense adhesions, from the abdominal wall to the uterus, were encountered, clamped, transected, and suture ligated with 0 Vicryl suture. Ultimately, the lower  uterine segment was identified and a low transverse uterine incision was made. Upon entry into the endometrial cavity, clear fluid resulted. The excision was extended with blunt transverse traction. Fetal occiput was brought to the incision and vacuum applied to the head. One gentle pull allowed for delivery of the head. A vigorous female's cord was doubly clamped and the infant was passed to Dr. Abundio Miu, neonatologist, who assigned Apgar scores of 5 and 9. At this point, continued dissection of the uterine adhesions ensued and once we were able to bring out the placenta, which was manually delivered, the uterus was exteriorized. The endometrial cavity was wiped clean with laparotomy tape, and the cervix was opened with a ring forceps and passed off the operative field. The uterine incision was closed with one chromic suture in a running locking fashion with good approximation of edges. Good hemostasis was noted. Multiple defects in the uterine serosa were encountered which required multiple sutures of 3-0 Vicryl, 0 Vicryl, figure-of-eights and running sutures. General ooze ultimately was controlled.   Attention was directed to the patient's right fallopian tube. It was grasped in the midportion. Two separate 0 plain gut sutures were applied, and a 1.5 cm portion of the fallopian tube was removed. A similar procedure was repeated on the patient's left fallopian tube. After two separate 0 plain gut sutures, a 1.5 cm portion of the fallopian tube was removed.   The posterior cul-de-sac was suctioned. The uterus was placed back into the abdominal cavity. The paracolic gutters were wiped clean with laparotomy tape. Given the amount of slow ooze, FloSeal was applied on the raw serosal sites and over the incision site. Interceed was placed above this in a T-shaped fashion. The On-Q pump system was then placed subfascially, two separate  catheters were placed. Once catheters were in place, the fascia was then closed  with 0 Vicryl suture in a running nonlocking fashion with good approximation of edges. Subcutaneous tissues were irrigated and bovied for hemostasis. The skin was reapproximated with staples. The On-Q pump was secured at the skin and Tegaderm placed. Both catheters were loaded with 5 mL of 0.5% Marcaine. Estimated blood loss was 800 mL. Intraoperative fluids were 2 liters. The patient was taken to the recovery room in good condition. ____________________________ Suzy Bouchardhomas J. Schermerhorn, MD tjs:slb D: 06/22/2011 10:01:23 ET T: 06/22/2011 11:34:06 ET JOB#: [id number]  cc: Suzy Bouchardhomas J. Schermerhorn, MD, <Dictator> Suzy BouchardHOMAS J SCHERMERHORN MD ELECTRONICALLY SIGNED 06/25/2011 9:24

## 2014-08-29 NOTE — Discharge Summary (Signed)
PATIENT NAME:  Sheena Combs, Sheena Combs MR#:  [id number] DATE OF BIRTH:  06-21-1980  DATE OF ADMISSION:  06/22/2011 DATE OF DISCHARGE:  06/25/2011  PRINCIPLE PROCEDURE: Elective repeat cesarean section, elective and bilateral tubal ligation.   HOSPITAL COURSE: The patient underwent the above procedure which was complicated by history of thrombocytopenia. The patient underwent general endotracheal anesthesia for her fourth cesarean section. At the time of surgery, extensive adhesions were noted. Surgery entailed adhesiolysis and delivery of a female infant, Apgars 5 and 9. The patient underwent a Pomeroy bilateral tubal ligation. Postoperatively, the patient underwent platelet transfusion given preop platelet count of 91,000 and general ooze of incision line during the procedure. On the day of the procedure, hematocrit was 25.4 and platelet count 81,000. On postoperative day number two, hematocrit was 23.2 and platelet count 100,000. The patient was without symptoms on the day of discharge, ambulating well without dizziness, and without orthostatic changes. The patient used the On-Q pump for pain medication postoperative; in addition, she used Percocet. The patient is discharged to home in good condition. Staples were removed and Steri-Strips applied. The patient will continue with the On-Q pump for the next day or two at home and will remove this on her own.   DISCHARGE MEDICATIONS:  1. Percocet 5/325 mg 1 to 2 tablets every four hours as needed for pain. 2. Naprosyn 500 mg twice a day as needed for pain. 3. Ferro-Sequels tablets 1 tablet twice a day for the next 30 days.  DISCHARGE FOLLOWUP: She will follow-up with Dr. Feliberto GottronSchermerhorn in two weeks for wound care or before if she has wound drainage, fever, or increasing abdominal pain. ____________________________ Suzy Bouchardhomas J. Wilfrid Hyser, MD tjs:slb Combs: 06/25/2011 09:18:36 ET     T: 06/25/2011 09:38:41 ET       JOB#: [id number] Suzy BouchardHOMAS J Arjay Jaskiewicz  MD ELECTRONICALLY SIGNED 06/25/2011 10:59

## 2014-12-26 ENCOUNTER — Emergency Department
Admission: EM | Admit: 2014-12-26 | Discharge: 2014-12-26 | Disposition: A | Discharge status: Home / Self Care | Payer: Medicaid Other | Attending: Emergency Medicine | Admitting: Emergency Medicine

## 2014-12-26 ENCOUNTER — Encounter: Payer: Self-pay | Admitting: *Deleted

## 2014-12-26 DIAGNOSIS — M546 Pain in thoracic spine: Secondary | ICD-10-CM | POA: Diagnosis present

## 2014-12-26 DIAGNOSIS — I10 Essential (primary) hypertension: Secondary | ICD-10-CM | POA: Insufficient documentation

## 2014-12-26 DIAGNOSIS — Z720 Tobacco use: Secondary | ICD-10-CM | POA: Insufficient documentation

## 2014-12-26 DIAGNOSIS — Z0189 Encounter for other specified special examinations: Secondary | ICD-10-CM

## 2014-12-26 DIAGNOSIS — L02212 Cutaneous abscess of back [any part, except buttock]: Secondary | ICD-10-CM

## 2014-12-26 DIAGNOSIS — Z7689 Persons encountering health services in other specified circumstances: Secondary | ICD-10-CM

## 2014-12-26 HISTORY — DX: Essential (primary) hypertension: I10

## 2014-12-26 MED ORDER — SULFAMETHOXAZOLE-TRIMETHOPRIM 800-160 MG PO TABS: 2.0000 | ORAL_TABLET | Freq: Two times a day (BID) | ORAL | Status: DC

## 2014-12-26 MED ORDER — LIDOCAINE-EPINEPHRINE (PF) 1 %-1:200000 IJ SOLN
30.0000 mL | Freq: Once | INTRAMUSCULAR | Status: DC
  Filled 2014-12-26: qty 30

## 2014-12-26 MED ORDER — TRAMADOL HCL 50 MG PO TABS: 50.0000 mg | ORAL_TABLET | Freq: Two times a day (BID) | ORAL | Status: DC

## 2014-12-26 NOTE — ED Notes (Signed)
Pt states she has had a bump on the left mid back, not draining at this time, for about 3 days

## 2014-12-26 NOTE — ED Notes (Signed)
AAOx3.  Skin warm and dry.  NAD 

## 2014-12-26 NOTE — ED Provider Notes (Addendum)
[facility] Emergency Department Provider Note ____________________________________________  Time seen: 1625  I have reviewed the triage vital signs and the nursing notes.  HISTORY  Chief Complaint  Abscess  HPI Sheena Combs is a 34 y.o. female reports to the ED for treatment of a tender, firm, hard spot to the left mid back for the last 3 days. She reports that the area seemed to worsen after she tried to squeeze it yesterday. She was only able to express a small amount of blood in that area. Since that time she's had significant increase to the tinnitus, and firmness of the area. She denies any previous history of abscesses or cellulitis. She denies any interim fevers.  Past Medical History  Diagnosis Date  . Hypertension    There are no active problems to display for this patient.  Past Surgical History  Procedure Laterality Date  . Cesarean section     Current Outpatient Rx  Name  Route  Sig  Dispense  Refill  . sulfamethoxazole-trimethoprim (BACTRIM DS,SEPTRA DS) 800-160 MG per tablet   Oral   Take 2 tablets by mouth 2 (two) times daily.   40 tablet   0   . traMADol (ULTRAM) 50 MG tablet   Oral   Take 1 tablet (50 mg total) by mouth 2 (two) times daily.   10 tablet   0    Allergies Review of patient's allergies indicates no known allergies.  No family history on file.  Social History Social History  Substance Use Topics  . Smoking status: Current Some Day Smoker  . Smokeless tobacco: None  . Alcohol Use: Yes   Review of Systems  Constitutional: Negative for fever. Eyes: Negative for visual changes. ENT: Negative for sore throat. Cardiovascular: Negative for chest pain. Respiratory: Negative for shortness of breath. Gastrointestinal: Negative for abdominal pain, vomiting and diarrhea. Genitourinary: Negative for dysuria. Musculoskeletal: Negative for back pain. Skin: Negative for rash. Cellulitis as directed.  Neurological:  Negative for headaches, focal weakness or numbness. ____________________________________________  PHYSICAL EXAM:  VITAL SIGNS: ED Triage Vitals  Enc Vitals Group     BP 12/26/14 1701 129/65 mmHg     Pulse Rate 12/26/14 1701 73     Resp 12/26/14 1701 16     Temp 12/26/14 1701 98.5 F (36.9 C)     Temp Source 12/26/14 1701 Oral     SpO2 12/26/14 1701 98 %     Weight 12/26/14 1701 186 lb (84.369 kg)     Height 12/26/14 1701  (1.575 m)     Head Cir --      Peak Flow --      Pain Score 12/26/14 1701 9     Pain Loc --      Pain Edu? --      Excl. in GC? --    Constitutional: Alert and oriented. Well appearing and in no distress. Eyes: Conjunctivae are normal. PERRL. Normal extraocular movements. ENT   Head: Normocephalic and atraumatic.   Nose: No congestion/rhinnorhea.   Mouth/Throat: Mucous membranes are moist.   Neck: Supple. No thyromegaly. Hematological/Lymphatic/Immunilogical: No cervical lymphadenopathy. Cardiovascular: Normal rate, regular rhythm.  Respiratory: Normal respiratory effort. No wheezes/rales/rhonchi. Gastrointestinal: Soft and nontender. No distention. Musculoskeletal: Nontender with normal range of motion in all extremities.  Neurologic:  Normal gait without ataxia. Normal speech and language. No gross focal neurologic deficits are appreciated. Skin:  Skin is warm, dry and intact. No rash noted. Left midback with a large, firm,  indurated area with small, pointing pustule. Deep, palpable abscess formation noted.  Psychiatric: Mood and affect are normal. Patient exhibits appropriate insight and judgment. ____________________________________________  INCISION AND DRAINAGE Performed by: Lissa Hoard Consent: Verbal consent obtained. Risks and benefits: risks, benefits and alternatives were discussed Type: abscess  Body area: right upper back  Anesthesia: local infiltration  Incision was made with a scalpel.  Local  anesthetic: lidocaine 1% w/ epinephrine  Anesthetic total: 7 ml  Complexity: complex Blunt dissection to break up loculations  Drainage: bloody/cheesy  Drainage amount: 3 ml  Packing material: 1/4 in iodoform gauze  Patient tolerance: Patient tolerated the procedure well with no immediate complications. ____________________________________________  INITIAL IMPRESSION / ASSESSMENT AND PLAN / ED COURSE  Upper back cellulitis and abscess s/p I&D. Wound care instructions provided. Return as directed for wound check. Bactrim DS provided for infection control. ____________________________________________  FINAL CLINICAL IMPRESSION(S) / ED DIAGNOSES  Final diagnoses:  Encounter for incision and drainage procedure  Abscess of upper back excluding scapular region     Lissa Hoard, PA-C 12/26/14 1848  Arnaldo Natal, MD 12/26/14 1854  Charlesetta Ivory Preston, PA-C 12/26/14 2213  Arnaldo Natal, MD 12/26/14 2217

## 2014-12-26 NOTE — Discharge Instructions (Signed)
Abscess An abscess (boil or furuncle) is an infected area on or under the skin. This area is filled with yellowish-white fluid (pus) and other material (debris). HOME CARE   Only take medicines as told by your doctor.  If you were given antibiotic medicine, take it as directed. Finish the medicine even if you start to feel better.  If gauze is used, follow your doctor's directions for changing the gauze.  To avoid spreading the infection:  Keep your abscess covered with a bandage.  Wash your hands well.  Do not share personal care items, towels, or whirlpools with others.  Avoid skin contact with others.  Keep your skin and clothes clean around the abscess.  Keep all doctor visits as told. GET HELP RIGHT AWAY IF:   You have more pain, puffiness (swelling), or redness in the wound site.  You have more fluid or blood coming from the wound site.  You have muscle aches, chills, or you feel sick.  You have a fever. MAKE SURE YOU:   Understand these instructions.  Will watch your condition.  Will get help right away if you are not doing well or get worse. Document Released: 10/10/2007 Document Revised: 10/23/2011 Document Reviewed: 07/06/2011 [facility] Patient Information 2015 [place], [place]. This information is not intended to replace advice given to you by your health care provider. Make sure you discuss any questions you have with your health care provider.  Incision and Drainage Incision and drainage is a procedure in which a sac-like structure (cystic structure) is opened and drained. The area to be drained usually contains material such as pus, fluid, or blood.  LET YOUR CAREGIVER KNOW ABOUT:   Allergies to medicine.  Medicines taken, including vitamins, herbs, eyedrops, over-the-counter medicines, and creams.  Use of steroids (by mouth or creams).  Previous problems with anesthetics or numbing medicines.  History of bleeding problems or blood clots.  Previous  surgery.  Other health problems, including diabetes and kidney problems.  Possibility of pregnancy, if this applies. RISKS AND COMPLICATIONS  Pain.  Bleeding.  Scarring.  Infection. BEFORE THE PROCEDURE  You may need to have an ultrasound or other imaging tests to see how large or deep your cystic structure is. Blood tests may also be used to determine if you have an infection or how severe the infection is. You may need to have a tetanus shot. PROCEDURE  The affected area is cleaned with a cleaning fluid. The cyst area will then be numbed with a medicine (local anesthetic). A small incision will be made in the cystic structure. A syringe or catheter may be used to drain the contents of the cystic structure, or the contents may be squeezed out. The area will then be flushed with a cleansing solution. After cleansing the area, it is often gently packed with a gauze or another wound dressing. Once it is packed, it will be covered with gauze and tape or some other type of wound dressing. AFTER THE PROCEDURE   Often, you will be allowed to go home right after the procedure.  You may be given antibiotic medicine to prevent or heal an infection.  If the area was packed with gauze or some other wound dressing, you will likely need to come back in 1 to 2 days to get it removed.  The area should heal in about 14 days. Document Released: 10/17/2000 Document Revised: 10/23/2011 Document Reviewed: 06/18/2011 [facility] Patient Information 2015 [place], [place]. This information is not intended to replace advice  given to you by your health care provider. Make sure you discuss any questions you have with your health care provider.  Take the antibiotic as directed until completely gone. Return as directed for wound check.

## 2014-12-30 ENCOUNTER — Encounter: Payer: Self-pay | Admitting: Emergency Medicine

## 2014-12-30 ENCOUNTER — Emergency Department
Admission: EM | Admit: 2014-12-30 | Discharge: 2014-12-30 | Disposition: A | Discharge status: Home / Self Care | Payer: Medicaid Other | Attending: Emergency Medicine | Admitting: Emergency Medicine

## 2014-12-30 DIAGNOSIS — Z48817 Encounter for surgical aftercare following surgery on the skin and subcutaneous tissue: Secondary | ICD-10-CM | POA: Diagnosis not present

## 2014-12-30 DIAGNOSIS — Z720 Tobacco use: Secondary | ICD-10-CM | POA: Diagnosis not present

## 2014-12-30 DIAGNOSIS — Z5189 Encounter for other specified aftercare: Secondary | ICD-10-CM

## 2014-12-30 DIAGNOSIS — I10 Essential (primary) hypertension: Secondary | ICD-10-CM | POA: Diagnosis not present

## 2014-12-30 DIAGNOSIS — Z79899 Other long term (current) drug therapy: Secondary | ICD-10-CM | POA: Diagnosis not present

## 2014-12-30 NOTE — ED Notes (Signed)
Pt in to have packing removed from abscessed. Pt states placed on Sunday.

## 2014-12-30 NOTE — ED Provider Notes (Signed)
[facility] Emergency Department Provider Note  ____________________________________________  Time seen: Approximately 12:04 PM  I have reviewed the triage vital signs and the nursing notes.   HISTORY  Chief Complaint Wound Check    HPI Sheena Combs is a 34 y.o. female is here to have packing removed from a abscess that was I&D on 8/21. Patient is continue to take her antibiotic's not having any problems with it. She denies any fever or chills. She denies any pain.   Past Medical History  Diagnosis Date  . Hypertension     There are no active problems to display for this patient.   Past Surgical History  Procedure Laterality Date  . Cesarean section      Current Outpatient Rx  Name  Route  Sig  Dispense  Refill  . sulfamethoxazole-trimethoprim (BACTRIM DS,SEPTRA DS) 800-160 MG per tablet   Oral   Take 2 tablets by mouth 2 (two) times daily.   40 tablet   0   . traMADol (ULTRAM) 50 MG tablet   Oral   Take 1 tablet (50 mg total) by mouth 2 (two) times daily.   10 tablet   0     Allergies Review of patient's allergies indicates no known allergies.  No family history on file.  Social History Social History  Substance Use Topics  . Smoking status: Current Some Day Smoker  . Smokeless tobacco: None  . Alcohol Use: Yes    Review of Systems Constitutional: No fever/chills Cardiovascular: Denies chest pain. Respiratory: Denies shortness of breath. Gastrointestinal: No abdominal pain.  No nausea, no vomiting.  Musculoskeletal: Negative for back pain. Skin: Negative for rash. Neurological: Negative for headaches, focal weakness or numbness.  10-point ROS otherwise negative.  ____________________________________________   PHYSICAL EXAM:  VITAL SIGNS: ED Triage Vitals  Enc Vitals Group     BP 12/30/14 1145 137/60 mmHg     Pulse Rate 12/30/14 1145 74     Resp 12/30/14 1145 20     Temp 12/30/14 1145 99.3 F (37.4 C)   Temp Source 12/30/14 1145 Oral     SpO2 12/30/14 1145 98 %     Weight 12/30/14 1145 185 lb (83.915 kg)     Height 12/30/14 1145 5\' 2"  (1.575 m)     Head Cir --      Peak Flow --      Pain Score --      Pain Loc --      Pain Edu? --      Excl. in GC? --     Constitutional: Alert and oriented. Well appearing and in no acute distress. Eyes: Conjunctivae are normal. PERRL. EOMI. Head: Atraumatic. Nose: No congestion/rhinnorhea. Neck: No stridor.   Respiratory: Normal respiratory effort.  No retractions.  Gastrointestinal: No distention. Neurologic:  Normal speech and language. No gross focal neurologic deficits are appreciated. No gait instability. Skin:  Skin is warm, dry. Incision site seems to be healing without any signs of infection. Psychiatric: Mood and affect are normal. Speech and behavior are normal.  ____________________________________________   LABS (all labs ordered are listed, but only abnormal results are displayed)  Labs Reviewed - No data to display _ PROCEDURES  Procedure(s) performed: idoform packing was removed from the incision site without difficulty. Area is healing without any signs of infection.  Critical Care performed: No  ____________________________________________   INITIAL IMPRESSION / ASSESSMENT AND PLAN / ED COURSE  Pertinent labs & imaging results that were available during  my care of the patient were reviewed by me and considered in my medical decision making (see chart for details).  Patient is continue taking her antibiotic's as prescribed. She is to follow-up with her PCP if any continued problems or return to the emergency room if any severe worsening. ____________________________________________   FINAL CLINICAL IMPRESSION(S) / ED DIAGNOSES  Final diagnoses:  Wound check, abscess      Tommi Rumps, PA-C 12/30/14 1218  Tommi Rumps, PA-C 12/30/14 1244  Jene Every, MD 12/30/14 [phone]

## 2014-12-30 NOTE — ED Notes (Signed)
Here for wound check

## 2015-11-30 ENCOUNTER — Encounter: Payer: Self-pay | Admitting: Emergency Medicine

## 2015-11-30 DIAGNOSIS — N390 Urinary tract infection, site not specified: Secondary | ICD-10-CM | POA: Diagnosis not present

## 2015-11-30 DIAGNOSIS — I10 Essential (primary) hypertension: Secondary | ICD-10-CM | POA: Insufficient documentation

## 2015-11-30 DIAGNOSIS — F17210 Nicotine dependence, cigarettes, uncomplicated: Secondary | ICD-10-CM | POA: Diagnosis not present

## 2015-11-30 DIAGNOSIS — R1013 Epigastric pain: Secondary | ICD-10-CM | POA: Diagnosis present

## 2015-11-30 LAB — POCT PREGNANCY, URINE: PREG TEST UR: NEGATIVE

## 2015-11-30 NOTE — ED Triage Notes (Signed)
Patient ambulatory to triage with steady gait, without difficulty or distress noted; pt reports today having mid upper abd pain, V x 3; denies hx of same

## 2015-12-01 ENCOUNTER — Emergency Department: Payer: Medicaid Other

## 2015-12-01 ENCOUNTER — Emergency Department
Admission: EM | Admit: 2015-12-01 | Discharge: 2015-12-01 | Disposition: A | Discharge status: Home / Self Care | Payer: Medicaid Other | Attending: Emergency Medicine | Admitting: Emergency Medicine

## 2015-12-01 DIAGNOSIS — N390 Urinary tract infection, site not specified: Secondary | ICD-10-CM

## 2015-12-01 DIAGNOSIS — R1013 Epigastric pain: Secondary | ICD-10-CM

## 2015-12-01 LAB — COMPREHENSIVE METABOLIC PANEL
ALBUMIN: 3.8 g/dL (ref 3.5–5.0)
ALK PHOS: 67 U/L (ref 38–126)
ALT: 16 U/L (ref 14–54)
ANION GAP: 9 (ref 5–15)
AST: 25 U/L (ref 15–41)
BILIRUBIN TOTAL: 0.4 mg/dL (ref 0.3–1.2)
BUN: 11 mg/dL (ref 6–20)
CALCIUM: 8.8 mg/dL — AB (ref 8.9–10.3)
CO2: 24 mmol/L (ref 22–32)
Chloride: 104 mmol/L (ref 101–111)
Creatinine, Ser: 0.64 mg/dL (ref 0.44–1.00)
GFR calc non Af Amer: >60 mL/min (ref >60)
GLUCOSE: 121 mg/dL — AB (ref 65–99)
POTASSIUM: 3.2 mmol/L — AB (ref 3.5–5.1)
SODIUM: 137 mmol/L (ref 135–145)
TOTAL PROTEIN: 7.7 g/dL (ref 6.5–8.1)

## 2015-12-01 LAB — CBC
HCT: 38.6 % (ref 35.0–47.0)
HEMOGLOBIN: 12.8 g/dL (ref 12.0–16.0)
MCH: 28.5 pg (ref 26.0–34.0)
MCHC: 33.3 g/dL (ref 32.0–36.0)
MCV: 85.7 fL (ref 80.0–100.0)
PLATELETS: 134 10*3/uL — AB (ref 150–440)
RBC: 4.5 MIL/uL (ref 3.80–5.20)
RDW: 13 % (ref 11.5–14.5)
WBC: 6.7 10*3/uL (ref 3.6–11.0)

## 2015-12-01 LAB — URINALYSIS COMPLETE WITH MICROSCOPIC (ARMC ONLY)
BILIRUBIN URINE: NEGATIVE
Bacteria, UA: NONE SEEN
GLUCOSE, UA: NEGATIVE mg/dL
Ketones, ur: NEGATIVE mg/dL
Nitrite: NEGATIVE
Protein, ur: 30 mg/dL — AB
Specific Gravity, Urine: 1.015 (ref 1.005–1.030)
pH: 5 (ref 5.0–8.0)

## 2015-12-01 LAB — TROPONIN I: Troponin I: <0.03 ng/mL (ref <0.03)

## 2015-12-01 LAB — LIPASE, BLOOD: Lipase: 22 U/L (ref 11–51)

## 2015-12-01 MED ORDER — PANTOPRAZOLE SODIUM 40 MG PO TBEC: 40.0000 mg | DELAYED_RELEASE_TABLET | Freq: Every day | ORAL | 1 refills | Status: DC

## 2015-12-01 MED ORDER — CEPHALEXIN 500 MG PO CAPS: 500.0000 mg | ORAL_CAPSULE | Freq: Two times a day (BID) | ORAL | 0 refills | Status: AC

## 2015-12-01 MED ORDER — PANTOPRAZOLE SODIUM 40 MG PO TBEC
40.0000 mg | DELAYED_RELEASE_TABLET | Freq: Once | ORAL | Status: AC
  Administered 2015-12-01: 40 mg via ORAL
  Filled 2015-12-01: qty 1

## 2015-12-01 NOTE — ED Notes (Signed)
Called lab to verify that they had urine sample and are running ua - lab tech verified that they are in the process of running ua that they had an issue with the machine

## 2015-12-01 NOTE — ED Provider Notes (Signed)
[facility] Emergency Department Provider Note  ____________________________________________  Time seen: 2:15 AM  I have reviewed the triage vital signs and the nursing notes.   HISTORY  Chief Complaint Abdominal Pain    HPI Sheena Combs is a 35 y.o. female presents with 8 out of 10 epigastric pain with onset approximately 6 PM today. Patient states she's had this pain in the past without any etiology. Patient denies any fever no diarrhea however does admit to nonbloody emesis.     Past Medical History:  Diagnosis Date  . Hypertension     There are no active problems to display for this patient.   Past Surgical History:  Procedure Laterality Date  . CESAREAN SECTION      Current Outpatient Rx  . Order #: [id number] Class: Print  . Order #: [id number] Class: Print    Allergies Review of patient's allergies indicates no known allergies.  No family history on file.  Social History Social History  Substance Use Topics  . Smoking status: Current Some Day Smoker    Types: Cigarettes  . Smokeless tobacco: Current User  . Alcohol use Yes    Review of Systems  Constitutional: Negative for fever. Eyes: Negative for visual changes. ENT: Negative for sore throat. Cardiovascular: Negative for chest pain. Respiratory: Negative for shortness of breath. Gastrointestinal: Positive for abdominal pain Genitourinary: Negative for dysuria. Musculoskeletal: Negative for back pain. Skin: Negative for rash. Neurological: Negative for headaches, focal weakness or numbness.   10-point ROS otherwise negative.  ____________________________________________   PHYSICAL EXAM:  VITAL SIGNS: ED Triage Vitals  Enc Vitals Group     BP 11/30/15 2331 (!) 145/101     Pulse Rate 11/30/15 2331 64     Resp 11/30/15 2331 18     Temp 11/30/15 2331 98.5 F (36.9 C)     Temp Source 11/30/15 2331 Oral     SpO2 11/30/15 2331 98 %     Weight 11/30/15 2331 200  lb (90.7 kg)     Height 11/30/15 2331 5\' 2"  (1.575 m)     Head Circumference --      Peak Flow --      Pain Score 11/30/15 2257 8     Pain Loc --      Pain Edu? --      Excl. in GC? --     Constitutional: Alert and oriented. Well appearing and in no distress. Eyes: Conjunctivae are normal. PERRL. Normal extraocular movements. ENT   Head: Normocephalic and atraumatic.   Nose: No congestion/rhinnorhea.   Mouth/Throat: Mucous membranes are moist.   Neck: No stridor. Hematological/Lymphatic/Immunilogical: No cervical lymphadenopathy. Cardiovascular: Normal rate, regular rhythm. Normal and symmetric distal pulses are present in all extremities. No murmurs, rubs, or gallops. Respiratory: Normal respiratory effort without tachypnea nor retractions. Breath sounds are clear and equal bilaterally. No wheezes/rales/rhonchi. Gastrointestinal: Epigastric discomfort with palpation. No distention. There is no CVA tenderness. Genitourinary: deferred Musculoskeletal: Nontender with normal range of motion in all extremities. No joint effusions.  No lower extremity tenderness nor edema. Neurologic:  Normal speech and language. No gross focal neurologic deficits are appreciated. Speech is normal.  Skin:  Skin is warm, dry and intact. No rash noted. Psychiatric: Mood and affect are normal. Speech and behavior are normal. Patient exhibits appropriate insight and judgment.  ____________________________________________    LABS (pertinent positives/negatives)  Labs Reviewed  COMPREHENSIVE METABOLIC PANEL - Abnormal; Notable for the following:       Result Value  Potassium 3.2 (*)    Glucose, Bld 121 (*)    Calcium 8.8 (*)    All other components within normal limits  CBC - Abnormal; Notable for the following:    Platelets 134 (*)    All other components within normal limits  URINALYSIS COMPLETEWITH MICROSCOPIC ([facility] ONLY) - Abnormal; Notable for the following:    Color, Urine YELLOW  (*)    APPearance CLEAR (*)    Hgb urine dipstick 2+ (*)    Protein, ur 30 (*)    Leukocytes, UA 1+ (*)    Squamous Epithelial / LPF 0-5 (*)    All other components within normal limits  LIPASE, BLOOD  TROPONIN I  POC URINE PREG, ED  POCT PREGNANCY, URINE     RADIOLOGY  CLINICAL DATA:  35 year old female with right upper quadrant abdominal pain EXAM: US ABDOMEN LIMITED - RIGHT UPPER QUADRANT COMPARISON:  None. FINDINGS: Gallbladder: No gallstones or wall thickening visualized. No sonographic Murphy sign noted by sonographer. Common bile duct: Diameter: 3 mm Liver: No focal lesion identified. Within normal limits in parenchymal echogenicity. IMPRESSION: Unremarkable right upper quadrant ultrasound. Electronically Signed   By: Elgie Collard M.D.      Procedures     INITIAL IMPRESSION / ASSESSMENT AND PLAN / ED COURSE  Pertinent labs & imaging results that were available during my care of the patient were reviewed by me and considered in my medical decision making (see chart for details).    ____________________________________________   FINAL CLINICAL IMPRESSION(S) / ED DIAGNOSES  Final diagnoses:  Epigastric pain  UTI (lower urinary tract infection)      Darci Current, MD 12/02/15 [phone]

## 2015-12-01 NOTE — ED Notes (Signed)
Pt reports that she is having chest pain that started around 6pm yesterday and it is mid sternal into upper mid abd - She feels like she is going to vomit or have a bowel movement constantly - She vomited 3 times in the last 24 hours - pt denies diarrhea - Pt says she feels that she may have food poisoning  Due to sharp pain after eating pizza today

## 2015-12-01 NOTE — ED Notes (Signed)
Discharge instructions reviewed with patient. Patient verbalized understanding. Patient ambulated to lobby without difficulty.   

## 2015-12-02 ENCOUNTER — Encounter: Payer: Self-pay | Admitting: Emergency Medicine

## 2016-01-05 DIAGNOSIS — K219 Gastro-esophageal reflux disease without esophagitis: Secondary | ICD-10-CM | POA: Insufficient documentation

## 2016-01-05 DIAGNOSIS — K2100 Gastro-esophageal reflux disease with esophagitis, without bleeding: Secondary | ICD-10-CM

## 2016-01-05 DIAGNOSIS — I10 Essential (primary) hypertension: Secondary | ICD-10-CM

## 2016-01-10 ENCOUNTER — Ambulatory Visit: Payer: Self-pay | Admitting: Gastroenterology

## 2016-09-24 ENCOUNTER — Emergency Department: Payer: Self-pay

## 2016-09-24 DIAGNOSIS — R079 Chest pain, unspecified: Secondary | ICD-10-CM | POA: Insufficient documentation

## 2016-09-24 DIAGNOSIS — R112 Nausea with vomiting, unspecified: Secondary | ICD-10-CM | POA: Insufficient documentation

## 2016-09-24 DIAGNOSIS — F17210 Nicotine dependence, cigarettes, uncomplicated: Secondary | ICD-10-CM | POA: Insufficient documentation

## 2016-09-24 DIAGNOSIS — Z5321 Procedure and treatment not carried out due to patient leaving prior to being seen by health care provider: Secondary | ICD-10-CM | POA: Insufficient documentation

## 2016-09-24 DIAGNOSIS — R1013 Epigastric pain: Secondary | ICD-10-CM | POA: Insufficient documentation

## 2016-09-24 DIAGNOSIS — I10 Essential (primary) hypertension: Secondary | ICD-10-CM | POA: Insufficient documentation

## 2016-09-24 LAB — CBC
HCT: 41.8 % (ref 35.0–47.0)
Hemoglobin: 13.7 g/dL (ref 12.0–16.0)
MCH: 28.6 pg (ref 26.0–34.0)
MCHC: 32.8 g/dL (ref 32.0–36.0)
MCV: 87.1 fL (ref 80.0–100.0)
PLATELETS: 157 10*3/uL (ref 150–440)
RBC: 4.79 MIL/uL (ref 3.80–5.20)
RDW: 13.3 % (ref 11.5–14.5)
WBC: 6.8 10*3/uL (ref 3.6–11.0)

## 2016-09-24 LAB — BASIC METABOLIC PANEL
Anion gap: 6 (ref 5–15)
BUN: 9 mg/dL (ref 6–20)
CALCIUM: 9.7 mg/dL (ref 8.9–10.3)
CO2: 27 mmol/L (ref 22–32)
CREATININE: 0.76 mg/dL (ref 0.44–1.00)
Chloride: 105 mmol/L (ref 101–111)
GFR calc non Af Amer: >60 mL/min (ref >60)
Glucose, Bld: 115 mg/dL — ABNORMAL HIGH (ref 65–99)
Potassium: 3.7 mmol/L (ref 3.5–5.1)
Sodium: 138 mmol/L (ref 135–145)

## 2016-09-24 LAB — TROPONIN I

## 2016-09-24 NOTE — ED Triage Notes (Signed)
Pt presents to ED with sharp chest pain underneath left breast and epigastric pain . S/s nausea, vomiting, and sweating x2 weeks.

## 2016-09-25 ENCOUNTER — Emergency Department
Admission: EM | Admit: 2016-09-25 | Discharge: 2016-09-25 | Disposition: A | Discharge status: Home / Self Care | Payer: Self-pay | Attending: Emergency Medicine | Admitting: Emergency Medicine

## 2016-09-25 ENCOUNTER — Telehealth: Payer: Self-pay | Admitting: Emergency Medicine

## 2016-09-25 NOTE — Telephone Encounter (Signed)
Called patient due to lwot to inquire about condition and follow up plans. Wrong number. 

## 2016-10-12 ENCOUNTER — Emergency Department: Payer: Self-pay

## 2016-10-12 ENCOUNTER — Emergency Department
Admission: EM | Admit: 2016-10-12 | Discharge: 2016-10-12 | Disposition: A | Discharge status: Home / Self Care | Payer: Self-pay | Attending: Emergency Medicine | Admitting: Emergency Medicine

## 2016-10-12 ENCOUNTER — Encounter: Payer: Self-pay | Admitting: Emergency Medicine

## 2016-10-12 DIAGNOSIS — I10 Essential (primary) hypertension: Secondary | ICD-10-CM | POA: Insufficient documentation

## 2016-10-12 DIAGNOSIS — R52 Pain, unspecified: Secondary | ICD-10-CM

## 2016-10-12 DIAGNOSIS — Z79899 Other long term (current) drug therapy: Secondary | ICD-10-CM | POA: Insufficient documentation

## 2016-10-12 DIAGNOSIS — F17210 Nicotine dependence, cigarettes, uncomplicated: Secondary | ICD-10-CM | POA: Insufficient documentation

## 2016-10-12 DIAGNOSIS — R1013 Epigastric pain: Secondary | ICD-10-CM | POA: Insufficient documentation

## 2016-10-12 LAB — COMPREHENSIVE METABOLIC PANEL
ALT: 14 U/L (ref 14–54)
ANION GAP: 5 (ref 5–15)
AST: 18 U/L (ref 15–41)
Albumin: 3.7 g/dL (ref 3.5–5.0)
Alkaline Phosphatase: 62 U/L (ref 38–126)
BUN: 11 mg/dL (ref 6–20)
CO2: 26 mmol/L (ref 22–32)
CREATININE: 0.58 mg/dL (ref 0.44–1.00)
Calcium: 9.1 mg/dL (ref 8.9–10.3)
Chloride: 106 mmol/L (ref 101–111)
Glucose, Bld: 100 mg/dL — ABNORMAL HIGH (ref 65–99)
POTASSIUM: 3.5 mmol/L (ref 3.5–5.1)
Sodium: 137 mmol/L (ref 135–145)
Total Bilirubin: 0.6 mg/dL (ref 0.3–1.2)
Total Protein: 7.7 g/dL (ref 6.5–8.1)

## 2016-10-12 LAB — CBC
HCT: 37.1 % (ref 35.0–47.0)
Hemoglobin: 12.4 g/dL (ref 12.0–16.0)
MCH: 28.8 pg (ref 26.0–34.0)
MCHC: 33.4 g/dL (ref 32.0–36.0)
MCV: 86.1 fL (ref 80.0–100.0)
PLATELETS: 167 10*3/uL (ref 150–440)
RBC: 4.3 MIL/uL (ref 3.80–5.20)
RDW: 12.9 % (ref 11.5–14.5)
WBC: 7.4 10*3/uL (ref 3.6–11.0)

## 2016-10-12 LAB — URINALYSIS, COMPLETE (UACMP) WITH MICROSCOPIC
Bilirubin Urine: NEGATIVE
Glucose, UA: NEGATIVE mg/dL
Hgb urine dipstick: NEGATIVE
KETONES UR: NEGATIVE mg/dL
Nitrite: NEGATIVE
PH: 5 (ref 5.0–8.0)
PROTEIN: NEGATIVE mg/dL
Specific Gravity, Urine: 1.019 (ref 1.005–1.030)

## 2016-10-12 LAB — LIPASE, BLOOD: LIPASE: 28 U/L (ref 11–51)

## 2016-10-12 LAB — POCT PREGNANCY, URINE: Preg Test, Ur: NEGATIVE

## 2016-10-12 MED ORDER — BISACODYL 5 MG PO TBEC
5.0000 mg | DELAYED_RELEASE_TABLET | Freq: Once | ORAL | Status: AC
  Administered 2016-10-12: 5 mg via ORAL
  Filled 2016-10-12 (×2): qty 1

## 2016-10-12 MED ORDER — IOPAMIDOL (ISOVUE-300) INJECTION 61%: 100.0000 mL | Freq: Once | INTRAVENOUS | Status: DC | PRN

## 2016-10-12 MED ORDER — FAMOTIDINE 20 MG PO TABS
40.0000 mg | ORAL_TABLET | Freq: Once | ORAL | Status: AC
  Administered 2016-10-12: 40 mg via ORAL
  Filled 2016-10-12: qty 2

## 2016-10-12 MED ORDER — DICYCLOMINE HCL 10 MG PO CAPS
20.0000 mg | ORAL_CAPSULE | Freq: Once | ORAL | Status: AC
  Administered 2016-10-12: 20 mg via ORAL
  Filled 2016-10-12: qty 2

## 2016-10-12 MED ORDER — IBUPROFEN 600 MG PO TABS
600.0000 mg | ORAL_TABLET | Freq: Once | ORAL | Status: AC
  Administered 2016-10-12: 600 mg via ORAL
  Filled 2016-10-12: qty 1

## 2016-10-12 MED ORDER — GI COCKTAIL ~~LOC~~
30.0000 mL | Freq: Once | ORAL | Status: AC
  Administered 2016-10-12: 30 mL via ORAL
  Filled 2016-10-12: qty 30

## 2016-10-12 MED ORDER — FAMOTIDINE 40 MG PO TABS: 40.0000 mg | ORAL_TABLET | Freq: Every evening | ORAL | 0 refills | Status: AC

## 2016-10-12 NOTE — ED Notes (Signed)

## 2016-10-12 NOTE — ED Provider Notes (Signed)
[facility] Emergency Department Provider Note  ____________________________________________   First MD Initiated Contact with Patient 10/12/16 1754     (approximate)  I have reviewed the triage vital signs and the nursing notes.   HISTORY  Chief Complaint Abdominal Pain    HPI Sheena Combs is a 36 y.o. female who self presents to the emergency Department with roughly 1 month of postprandial epigastric burning sensation. The pain is particularly worse when eating fatty foods. The pain is aching burning cramping. Improved when not eating. It is nonradiating. She has a history of abdominal surgeries. She does have nausea. No vomiting. No diarrhea. No fevers or chills.   Past Medical History:  Diagnosis Date  . GERD (gastroesophageal reflux disease)   . Hypertension     Patient Active Problem List   Diagnosis Date Noted  . Hypertension   . GERD (gastroesophageal reflux disease)     Past Surgical History:  Procedure Laterality Date  . CESAREAN SECTION      Prior to Admission medications   Medication Sig Start Date End Date Taking? Authorizing Provider  famotidine (PEPCID) 40 MG tablet Take 1 tablet (40 mg total) by mouth every evening. 10/12/16 10/12/17  Merrily Brittle, MD  pantoprazole (PROTONIX) 40 MG tablet Take 1 tablet (40 mg total) by mouth daily. 12/01/15 11/30/16  Darci Current, MD  sulfamethoxazole-trimethoprim (BACTRIM DS,SEPTRA DS) 800-160 MG per tablet Take 2 tablets by mouth 2 (two) times daily. 12/26/14   Menshew, Charlesetta Ivory, PA-C  traMADol (ULTRAM) 50 MG tablet Take 1 tablet (50 mg total) by mouth 2 (two) times daily. 12/26/14   Menshew, Charlesetta Ivory, PA-C    Allergies Patient has no known allergies.  No family history on file.  Social History Social History  Substance Use Topics  . Smoking status: Current Some Day Smoker    Types: Cigarettes  . Smokeless tobacco: Current User  . Alcohol use Yes    Review of  Systems Constitutional: No fever/chills Eyes: No visual changes. ENT: No sore throat. Cardiovascular: Denies chest pain. Respiratory: Denies shortness of breath. Gastrointestinal: Nausea abdominal pain.  Positive nausea, no vomiting.  No diarrhea.  No constipation. Genitourinary: Negative for dysuria. Musculoskeletal: Negative for back pain. Skin: Negative for rash. Neurological: Negative for headaches, focal weakness or numbness.   ____________________________________________   PHYSICAL EXAM:  VITAL SIGNS: ED Triage Vitals  Enc Vitals Group     BP 10/12/16 1401 116/74     Pulse Rate 10/12/16 1401 79     Resp 10/12/16 1401 16     Temp 10/12/16 1401 99.1 F (37.3 C)     Temp Source 10/12/16 1401 Oral     SpO2 10/12/16 1401 100 %     Weight 10/12/16 1400 230 lb (104.3 kg)     Height 10/12/16 1400 5\' 2"  (1.575 m)     Head Circumference --      Peak Flow --      Pain Score --      Pain Loc --      Pain Edu? --      Excl. in GC? --     Constitutional: Alert and oriented x 4 well appearing nontoxic no diaphoresis speaks in full, clear sentences Eyes: PERRL EOMI. Head: Atraumatic. Nose: No congestion/rhinnorhea. Mouth/Throat: No trismus Neck: No stridor.   Cardiovascular: Normal rate, regular rhythm. Grossly normal heart sounds.  Good peripheral circulation. Respiratory: Normal respiratory effort.  No retractions. Lungs CTAB and moving good  air Gastrointestinal: Obese soft nontender nondistended no rebound or guarding no peritonitis negative Murphy's no McBurney's tenderness negative Rovsing's Musculoskeletal: No lower extremity edema   Neurologic:  Normal speech and language. No gross focal neurologic deficits are appreciated. Skin:  Skin is warm, dry and intact. No rash noted. Psychiatric: Mood and affect are normal. Speech and behavior are normal.    ____________________________________________   DIFFERENTIAL  Biliary colic, cholecystitis, gastric reflux, gastric  ulcer, duodenal ulcer ____________________________________________   LABS (all labs ordered are listed, but only abnormal results are displayed)  Labs Reviewed  COMPREHENSIVE METABOLIC PANEL - Abnormal; Notable for the following:       Result Value   Glucose, Bld 100 (*)    All other components within normal limits  URINALYSIS, COMPLETE (UACMP) WITH MICROSCOPIC - Abnormal; Notable for the following:    Color, Urine YELLOW (*)    APPearance CLOUDY (*)    Leukocytes, UA LARGE (*)    Bacteria, UA FEW (*)    Squamous Epithelial / LPF 6-30 (*)    All other components within normal limits  LIPASE, BLOOD  CBC  POCT PREGNANCY, URINE    No signs of infection urinalysis is a dirty sample __________________________________________  EKG   ____________________________________________  RADIOLOGY  Normal ultrasound ____________________________________________   PROCEDURES  Procedure(s) performed: no  Procedures  Critical Care performed: no  Observation: no ____________________________________________   INITIAL IMPRESSION / ASSESSMENT AND PLAN / ED COURSE  Pertinent labs & imaging results that were available during my care of the patient were reviewed by me and considered in my medical decision making (see chart for details).  The patient arrives hemodynamically stable very well-appearing with a benign abdomen. Her pain is either likely biliary versus gastric. I will treat her with Bentyl now as well as the right upper quadrant ultrasound and reevaluate.  Fortunately the patient's ultrasound is negative for acute pathology. She was given a trial of Pepcid and GI cocktail which is nearly completely relieved her symptoms. She says that she has a tendency to eat fatty foods and then immediately lie down in bed and we discussed that this likely is causing gastric reflux. I will give her 1 month trial of Pepcid and refer her back to primary care. She is discharged home in improved  condition.      ____________________________________________   FINAL CLINICAL IMPRESSION(S) / ED DIAGNOSES  Final diagnoses:  Pain  Epigastric pain      NEW MEDICATIONS STARTED DURING THIS VISIT:  Discharge Medication List as of 10/12/2016  9:34 PM    START taking these medications   Details  famotidine (PEPCID) 40 MG tablet Take 1 tablet (40 mg total) by mouth every evening., Starting Fri 10/12/2016, Until Sat 10/12/2017, Print         Note:  This document was prepared using Dragon voice recognition software and may include unintentional dictation errors.     Merrily Brittleifenbark, Earmon Sherrow, MD 10/12/16 [phone]

## 2016-10-12 NOTE — ED Notes (Signed)
Pt c/o generalized abdominal pain on and off for 1 month. Pt states that pain is worse after she eats then she vomits and the pain feels better. Patient states that she has tried tums without relief. PT denies hx/o gallbladder disease

## 2016-10-12 NOTE — ED Triage Notes (Addendum)
C/O stomach upset.  Came to ED about 2 weeks ago for similar symptoms, but LWBS.   Pain starts after eating and then about 2 hours later, patient vomits.

## 2016-10-12 NOTE — Discharge Instructions (Signed)
Please take your Pepcid once a day as prescribed and establish care with a primary care physician for reevaluation. Return to the emergency department for any concerns such as fevers, chills, if you cannot eat or drink, or for any other concerns.  It was a pleasure to take care of you today, and thank you for coming to our emergency department.  If you have any questions or concerns before leaving please ask the nurse to grab me and I'm more than happy to go through your aftercare instructions again.  If you were prescribed any opioid pain medication today such as Norco, Vicodin, Percocet, morphine, hydrocodone, or oxycodone please make sure you do not drive when you are taking this medication as it can alter your ability to drive safely.  If you have any concerns once you are home that you are not improving or are in fact getting worse before you can make it to your follow-up appointment, please do not hesitate to call 911 and come back for further evaluation.  Merrily BrittleNeil Tyshell Ramberg MD  Results for orders placed or performed during the hospital encounter of 10/12/16  Lipase, blood  Result Value Ref Range   Lipase 28 11 - 51 U/L  Comprehensive metabolic panel  Result Value Ref Range   Sodium 137 135 - 145 mmol/L   Potassium 3.5 3.5 - 5.1 mmol/L   Chloride 106 101 - 111 mmol/L   CO2 26 22 - 32 mmol/L   Glucose, Bld 100 (H) 65 - 99 mg/dL   BUN 11 6 - 20 mg/dL   Creatinine, Ser 1.610.58 0.44 - 1.00 mg/dL   Calcium 9.1 8.9 - 09.610.3 mg/dL   Total Protein 7.7 6.5 - 8.1 g/dL   Albumin 3.7 3.5 - 5.0 g/dL   AST 18 15 - 41 U/L   ALT 14 14 - 54 U/L   Alkaline Phosphatase 62 38 - 126 U/L   Total Bilirubin 0.6 0.3 - 1.2 mg/dL   GFR calc non Af Amer >60 >60 mL/min   GFR calc Af Amer >60 >60 mL/min   Anion gap 5 5 - 15  CBC  Result Value Ref Range   WBC 7.4 3.6 - 11.0 K/uL   RBC 4.30 3.80 - 5.20 MIL/uL   Hemoglobin 12.4 12.0 - 16.0 g/dL   HCT 04.537.1 40.935.0 - 81.147.0 %   MCV 86.1 80.0 - 100.0 fL   MCH 28.8 26.0  - 34.0 pg   MCHC 33.4 32.0 - 36.0 g/dL   RDW 91.412.9 78.211.5 - 95.614.5 %   Platelets 167 150 - 440 K/uL  Urinalysis, Complete w Microscopic  Result Value Ref Range   Color, Urine YELLOW (A) YELLOW   APPearance CLOUDY (A) CLEAR   Specific Gravity, Urine 1.019 1.005 - 1.030   pH 5.0 5.0 - 8.0   Glucose, UA NEGATIVE NEGATIVE mg/dL   Hgb urine dipstick NEGATIVE NEGATIVE   Bilirubin Urine NEGATIVE NEGATIVE   Ketones, ur NEGATIVE NEGATIVE mg/dL   Protein, ur NEGATIVE NEGATIVE mg/dL   Nitrite NEGATIVE NEGATIVE   Leukocytes, UA LARGE (A) NEGATIVE   RBC / HPF 0-5 0 - 5 RBC/hpf   WBC, UA 6-30 0 - 5 WBC/hpf   Bacteria, UA FEW (A) NONE SEEN   Squamous Epithelial / LPF 6-30 (A) NONE SEEN   Mucous PRESENT    Amorphous Crystal PRESENT   Pregnancy, urine POC  Result Value Ref Range   Preg Test, Ur NEGATIVE NEGATIVE   Dg Chest 2 View  Result Date:  09/24/2016 CLINICAL DATA:  Left-sided chest pain EXAM: CHEST  2 VIEW COMPARISON:  None. FINDINGS: The heart size and mediastinal contours are within normal limits. Both lungs are clear. The visualized skeletal structures are unremarkable. IMPRESSION: No active cardiopulmonary disease. Electronically Signed   By: Alcide Clever M.D.   On: 09/24/2016 20:56   US Abdomen Limited Ruq  Result Date: 10/12/2016 CLINICAL DATA:  Right upper quadrant abdominal pain. Pain is worse after eating. The patient 8 5 hours ago. EXAM: ULTRASOUND ABDOMEN LIMITED RIGHT UPPER QUADRANT COMPARISON:  Right upper quadrant ultrasound 12/01/2015 FINDINGS: Gallbladder: The gallbladder is contracted. Wall thickness is within normal limits at 1.5 mm. There is no sonographic Murphy sign. No stones are present. No mass lesion is present. Common bile duct: Diameter: 4.5 mm, within normal limits. Liver: No focal lesion identified. Within normal limits in parenchymal echogenicity. IMPRESSION: 1. No acute or focal rightward quadrant no abnormality to explain the patient's symptoms. Electronically Signed    By: Marin Roberts M.D.   On: 10/12/2016 19:31

## 2018-12-09 ENCOUNTER — Emergency Department
Admission: EM | Admit: 2018-12-09 | Discharge: 2018-12-09 | Disposition: A | Discharge status: Home / Self Care | Payer: Medicaid Other | Attending: Emergency Medicine | Admitting: Emergency Medicine

## 2018-12-09 ENCOUNTER — Other Ambulatory Visit: Payer: Self-pay

## 2018-12-09 ENCOUNTER — Encounter: Payer: Self-pay | Admitting: Emergency Medicine

## 2018-12-09 DIAGNOSIS — Y33XXXA Other specified events, undetermined intent, initial encounter: Secondary | ICD-10-CM | POA: Insufficient documentation

## 2018-12-09 DIAGNOSIS — I10 Essential (primary) hypertension: Secondary | ICD-10-CM | POA: Insufficient documentation

## 2018-12-09 DIAGNOSIS — Y92003 Bedroom of unspecified non-institutional (private) residence as the place of occurrence of the external cause: Secondary | ICD-10-CM | POA: Insufficient documentation

## 2018-12-09 DIAGNOSIS — T162XXA Foreign body in left ear, initial encounter: Secondary | ICD-10-CM | POA: Diagnosis present

## 2018-12-09 DIAGNOSIS — Y999 Unspecified external cause status: Secondary | ICD-10-CM | POA: Diagnosis not present

## 2018-12-09 DIAGNOSIS — Y9384 Activity, sleeping: Secondary | ICD-10-CM | POA: Diagnosis not present

## 2018-12-09 DIAGNOSIS — F17220 Nicotine dependence, chewing tobacco, uncomplicated: Secondary | ICD-10-CM | POA: Insufficient documentation

## 2018-12-09 MED ORDER — CARBAMIDE PEROXIDE 6.5 % OT SOLN
5.0000 [drp] | Freq: Once | OTIC | Status: AC
  Administered 2018-12-09: 5 [drp] via OTIC
  Filled 2018-12-09: qty 15

## 2018-12-09 NOTE — ED Provider Notes (Signed)
[facility] Emergency Department Provider Note  ____________________________________________   None    (approximate)  I have reviewed the triage vital signs and the nursing notes.   HISTORY  Chief Complaint Foreign Body in Ear   HPI Sheena Combs is a 38 y.o. female presents to the ED with foreign body to her left ear.  Patient states that she woke up this morning with discomfort in her left ear and believes that she has a bug in her ear.  She reports that she feels like there is something moving around.  She denies any fever, chills or URI symptoms.       Past Medical History:  Diagnosis Date  . GERD (gastroesophageal reflux disease)   . Hypertension     Patient Active Problem List   Diagnosis Date Noted  . Hypertension   . GERD (gastroesophageal reflux disease)     Past Surgical History:  Procedure Laterality Date  . CESAREAN SECTION      Prior to Admission medications   Medication Sig Start Date End Date Taking? Authorizing Provider  famotidine (PEPCID) 40 MG tablet Take 1 tablet (40 mg total) by mouth every evening. 10/12/16 10/12/17  Merrily Brittleifenbark, Neil, MD    Allergies Patient has no known allergies.  No family history on file.  Social History Social History   Tobacco Use  . Smoking status: Former Games developermoker  . Smokeless tobacco: Current User  Substance Use Topics  . Alcohol use: Yes  . Drug use: No    Review of Systems Constitutional: No fever/chills Eyes: No visual changes. ENT: Positive left ear pain. Cardiovascular: Denies chest pain. Respiratory: Denies shortness of breath. Skin: Negative for rash. Neurological: Negative for headaches, focal weakness or numbness. ____________________________________________   PHYSICAL EXAM:  VITAL SIGNS: ED Triage Vitals  Enc Vitals Group     BP 12/09/18 0652 (!) 145/91     Pulse Rate 12/09/18 0652 65     Resp 12/09/18 0652 18     Temp 12/09/18 0652 98.2 F (36.8 C)     Temp  Source 12/09/18 0652 Oral     SpO2 12/09/18 0652 96 %     Weight 12/09/18 0648 230 lb (104.3 kg)     Height 12/09/18 0648 5\' 2"  (1.575 m)     Head Circumference --      Peak Flow --      Pain Score 12/09/18 0648 0     Pain Loc --      Pain Edu? --      Excl. in GC? --     Constitutional: Alert and oriented. Well appearing and in no acute distress. Eyes: Conjunctivae are normal.  Head: Atraumatic. Nose: No congestion/rhinnorhea.  Left EAC with cerumen and possible insect. Neck: No stridor.   Cardiovascular: Normal rate, regular rhythm. Grossly normal heart sounds.  Good peripheral circulation. Respiratory: Normal respiratory effort.  No retractions. Lungs CTAB. Musculoskeletal: Moves upper and lower extremities without any difficulty.  Normal gait was noted. Neurologic:  Normal speech and language. No gross focal neurologic deficits are appreciated. No gait instability. Skin:  Skin is warm, dry and intact. No rash noted. Psychiatric: Mood and affect are normal. Speech and behavior are normal.  ____________________________________________   LABS (all labs ordered are listed, but only abnormal results are displayed)  Labs Reviewed - No data to display   PROCEDURES  Procedure(s) performed (including Critical Care):  Procedures   ____________________________________________   INITIAL IMPRESSION / ASSESSMENT AND PLAN /  ED COURSE  As part of my medical decision making, I reviewed the following data within the electronic MEDICAL RECORD NUMBER Notes from prior ED visits and [place] Controlled Substance Database  38 year old female presents to the ED with complaint of foreign body in her left ear.  Patient states that she woke this morning with discomfort in her ear and feels like there is something crawling around.  Initial exam showed cerumen and possibly an insect.  Debrox was placed in the ear which of course made the insect start moving and patient began screaming.  Pieces of the bug  was removed and patient was reassured.  Patient had no pain at the time of discharge.  ____________________________________________   FINAL CLINICAL IMPRESSION(S) / ED DIAGNOSES  Final diagnoses:  Foreign body of left ear, initial encounter     ED Discharge Orders    None       Note:  This document was prepared using Dragon voice recognition software and may include unintentional dictation errors.    Johnn Hai, PA-C 12/09/18 1051    Blake Divine, MD 12/09/18 [phone]

## 2018-12-09 NOTE — ED Triage Notes (Signed)
Pt presents to ED with FB in her left ear. Pt states she noticed movement in her ear when she woke up this morning.

## 2018-12-09 NOTE — ED Notes (Signed)
See triage note    Presents with possible f/b in left ear  States discomfort woke her up this am

## 2018-12-09 NOTE — Discharge Instructions (Signed)
Follow-up with Dr. Tami Ribas who is the ENT specialist who is on-call today if any continued problems.  It may take a couple of days for the water to completely leave your ear canal.  Sleep on your left side to help drain the water from your ear.  You may also take Tylenol or ibuprofen if needed for pain.

## 2019-04-20 IMAGING — US US ABDOMEN LIMITED
1 series · 14 of 25 positions shown · non-contrast
Comparison: Right upper quadrant ultrasound 12/01/2015

CLINICAL DATA: Right upper quadrant abdominal pain. Pain is worse
after eating. The patient 8 5 hours ago.

EXAM:
ULTRASOUND ABDOMEN LIMITED RIGHT UPPER QUADRANT

[Series 1: us abdomen limited · 0.23mm/px · 14 of 53 slices shown]
[im 1/53]
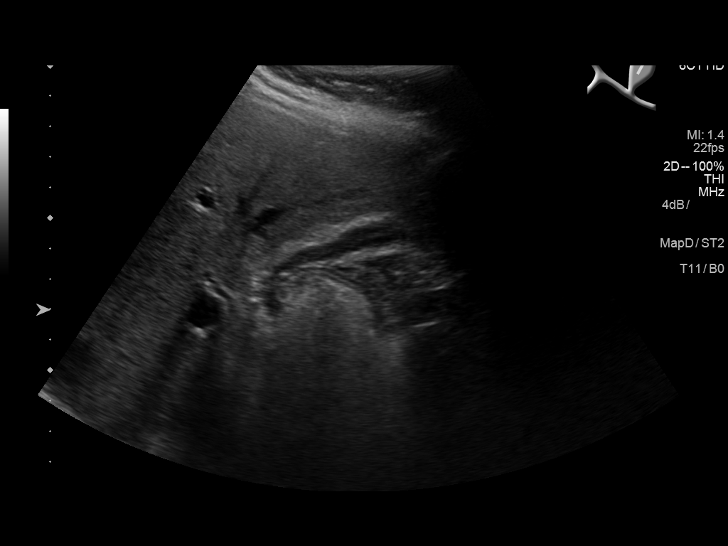
[im 5/53]
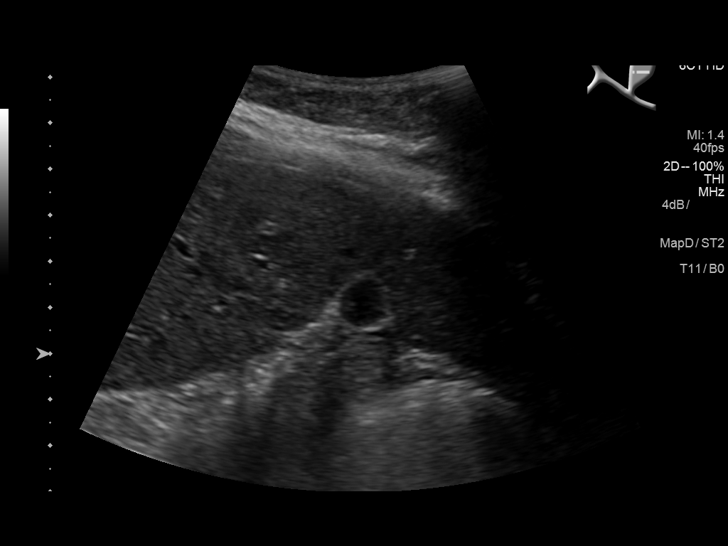
[im 9/53]
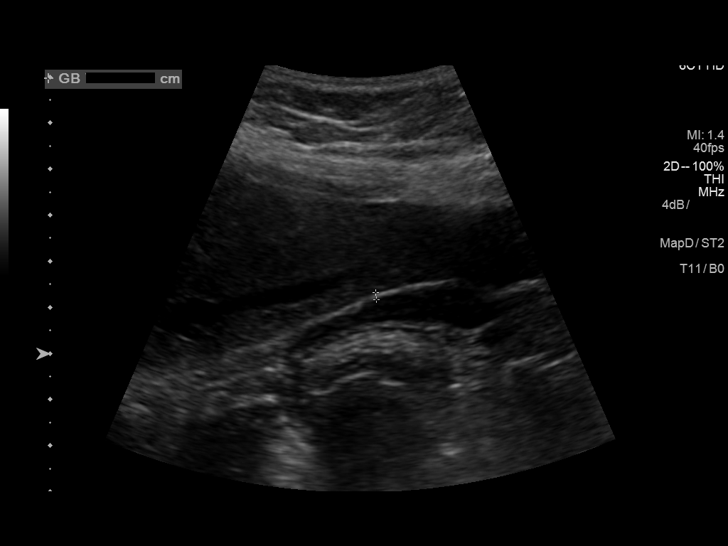
[im 14/53]
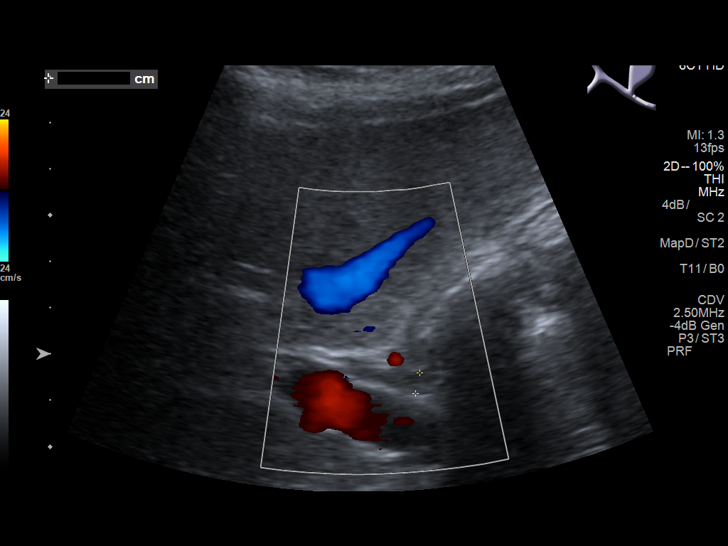
[im 18/53]
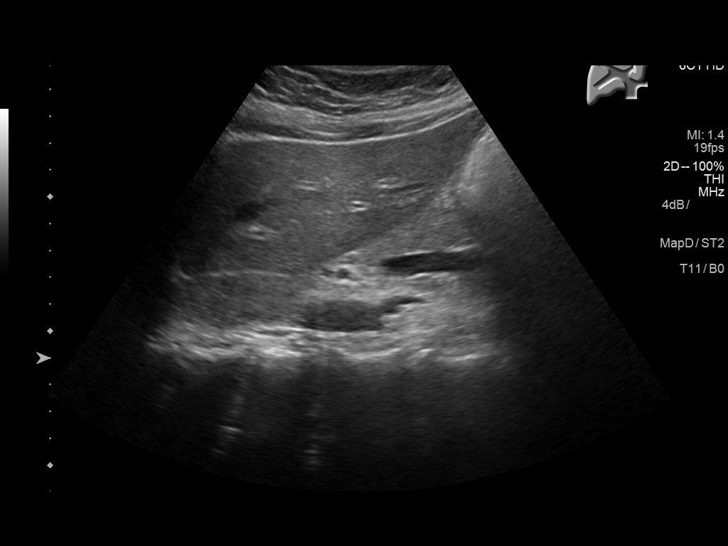
[im 20/53]
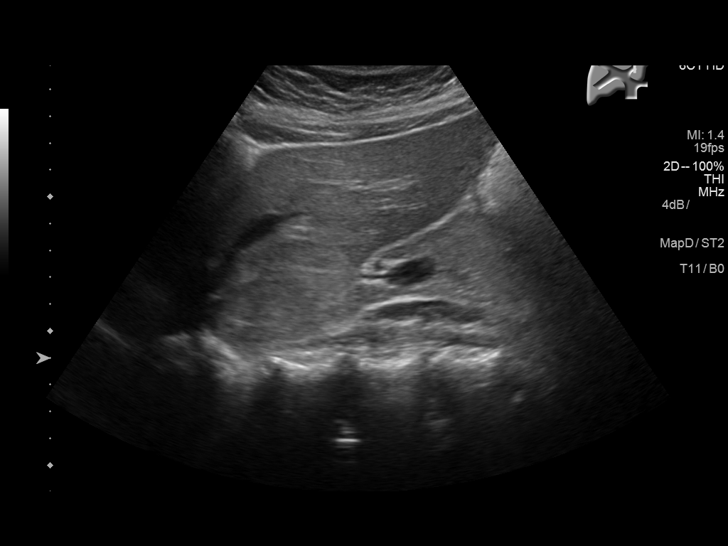
[im 24/53]
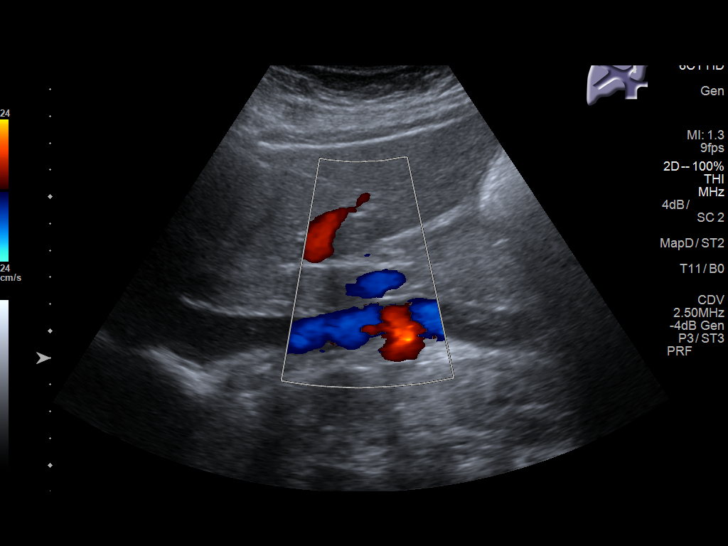
[im 29/53]
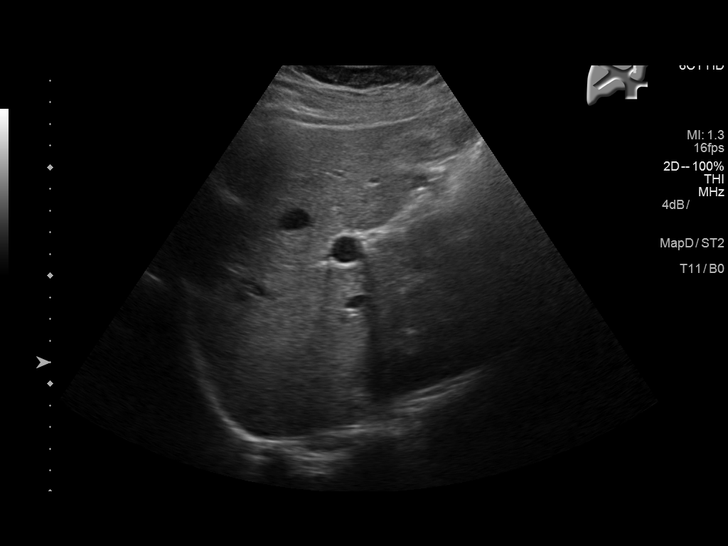
[im 33/53]
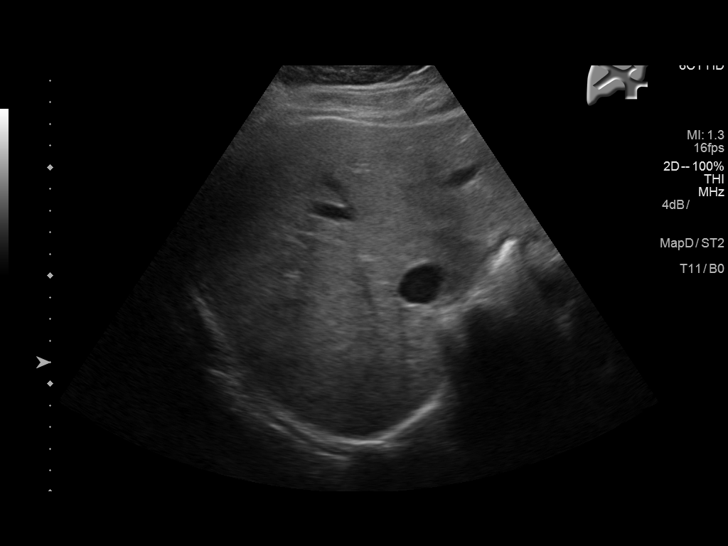
[im 35/53]
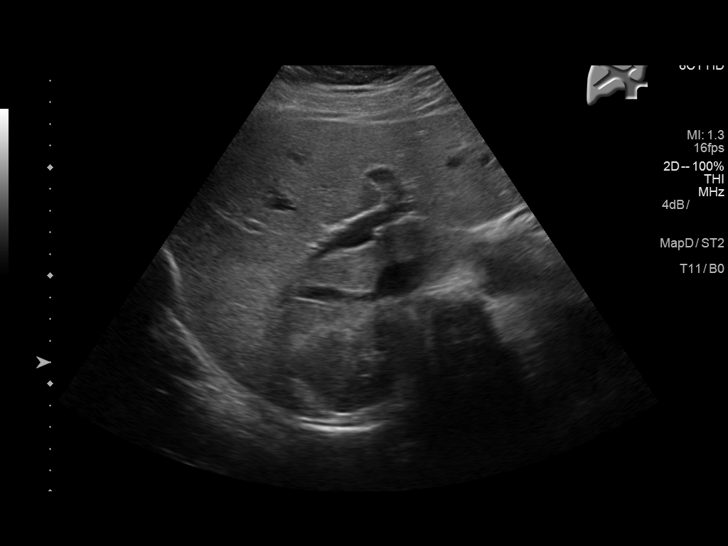
[im 40/53]
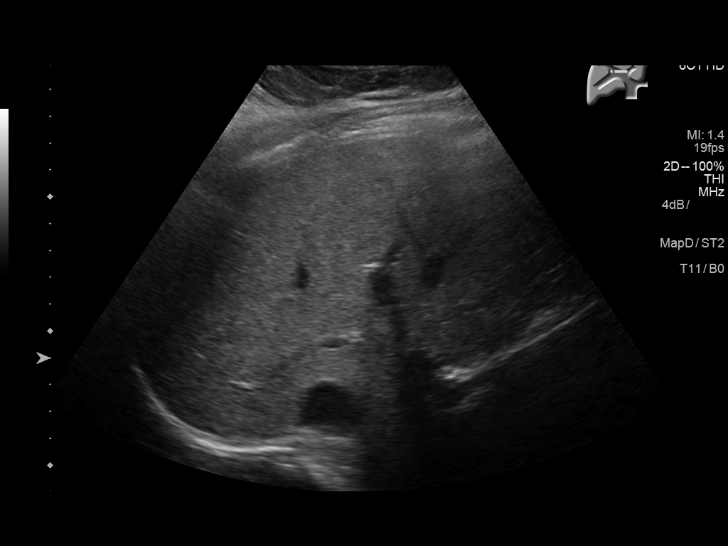
[im 44/53]
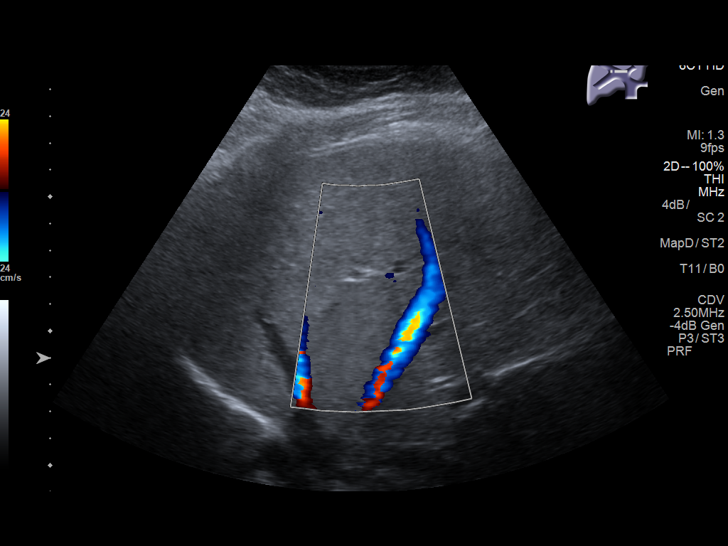
[im 48/53]
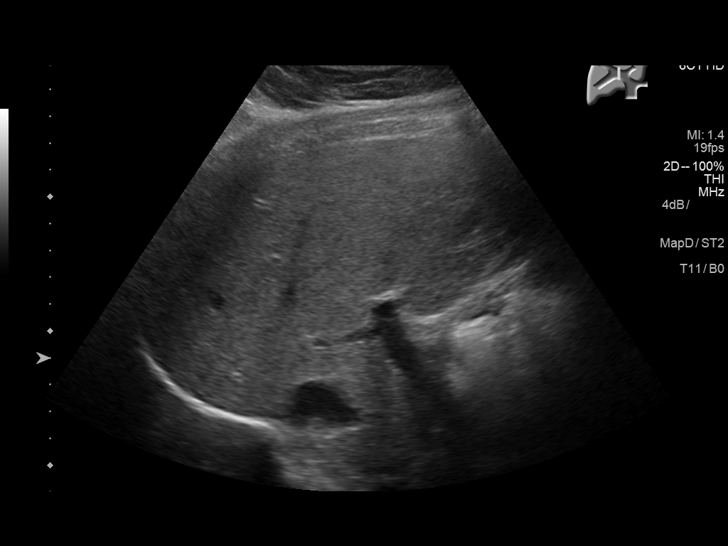
[im 53/53]
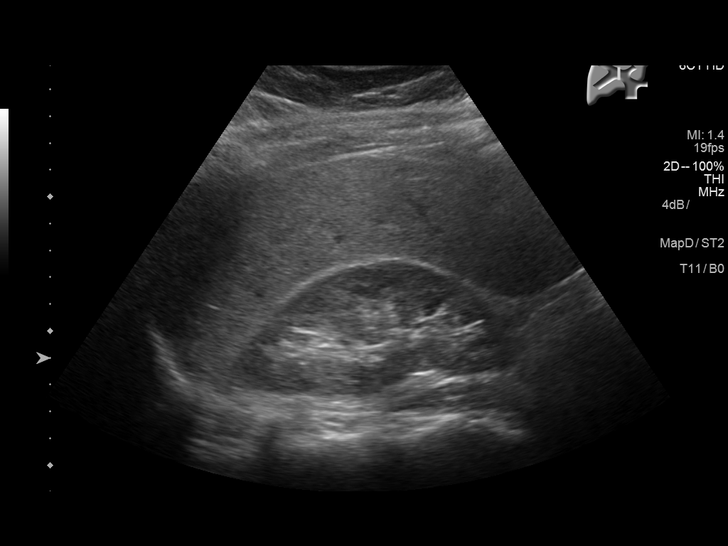

[14 of 25 positions shown; findings below may reference images not displayed]

FINDINGS: Gallbladder:

The gallbladder is contracted. Wall thickness is within normal
limits at 1.5 mm. There is no sonographic Murphy sign. No stones are
present. No mass lesion is present.

Common bile duct:

Diameter: 4.5 mm, within normal limits.

Liver:

No focal lesion identified. Within normal limits in parenchymal
echogenicity.
IMPRESSION: 1. No acute or focal rightward quadrant no abnormality to explain
the patient's symptoms.

## 2023-07-09 ENCOUNTER — Other Ambulatory Visit: Payer: Self-pay | Admitting: Family Medicine

## 2023-07-09 DIAGNOSIS — Z1231 Encounter for screening mammogram for malignant neoplasm of breast: Secondary | ICD-10-CM

## 2024-03-04 ENCOUNTER — Ambulatory Visit
Admission: RE | Admit: 2024-03-04 | Discharge: 2024-03-04 | Disposition: A | Discharge status: Home / Self Care | Source: Ambulatory Visit | Attending: Family Medicine | Admitting: Family Medicine

## 2024-03-04 DIAGNOSIS — Z1231 Encounter for screening mammogram for malignant neoplasm of breast: Secondary | ICD-10-CM | POA: Diagnosis present
